# Patient Record
Sex: Male | Born: 1990 | Race: Black or African American | Hispanic: No | Marital: Single
Health system: Midwestern US, Community
[De-identification: ages and names within clinical notes are randomized; demographics above are authoritative.]

## PROBLEM LIST (undated history)

## (undated) DIAGNOSIS — J45909 Unspecified asthma, uncomplicated: Secondary | ICD-10-CM

## (undated) HISTORY — PX: FRACTURE SURGERY: SHX138

---
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
## 2012-05-17 ENCOUNTER — Emergency Department: Payer: Self-pay | Admitting: Emergency Medicine

## 2013-06-02 ENCOUNTER — Emergency Department: Payer: Self-pay | Admitting: Emergency Medicine

## 2013-11-09 ENCOUNTER — Emergency Department: Payer: Self-pay | Admitting: Emergency Medicine

## 2013-11-09 LAB — CBC
HCT: 40.1 % (ref 40.0–52.0)
HGB: 12.7 g/dL — AB (ref 13.0–18.0)
MCH: 27.2 pg (ref 26.0–34.0)
MCHC: 31.8 g/dL — ABNORMAL LOW (ref 32.0–36.0)
MCV: 86 fL (ref 80–100)
PLATELETS: 231 10*3/uL (ref 150–440)
RBC: 4.69 10*6/uL (ref 4.40–5.90)
RDW: 13.8 % (ref 11.5–14.5)
WBC: 6.4 10*3/uL (ref 3.8–10.6)

## 2013-11-09 LAB — URINALYSIS, COMPLETE
Bacteria: NONE SEEN
Bilirubin,UR: NEGATIVE
Blood: NEGATIVE
GLUCOSE, UR: NEGATIVE mg/dL (ref 0–75)
Ketone: NEGATIVE
Nitrite: NEGATIVE
Ph: 6 (ref 4.5–8.0)
Protein: NEGATIVE
RBC,UR: 2 /HPF (ref 0–5)
SQUAMOUS EPITHELIAL: NONE SEEN
Specific Gravity: 1.024 (ref 1.003–1.030)
WBC UR: 95 /HPF (ref 0–5)

## 2013-11-09 LAB — COMPREHENSIVE METABOLIC PANEL
ALBUMIN: 3.5 g/dL (ref 3.4–5.0)
AST: 22 U/L (ref 15–37)
Alkaline Phosphatase: 99 U/L
Anion Gap: 5 — ABNORMAL LOW (ref 7–16)
BUN: 13 mg/dL (ref 7–18)
Bilirubin,Total: 0.4 mg/dL (ref 0.2–1.0)
CHLORIDE: 108 mmol/L — AB (ref 98–107)
CREATININE: 1.02 mg/dL (ref 0.60–1.30)
Calcium, Total: 8.4 mg/dL — ABNORMAL LOW (ref 8.5–10.1)
Co2: 28 mmol/L (ref 21–32)
EGFR (African American): >60
GLUCOSE: 86 mg/dL (ref 65–99)
Osmolality: 281 (ref 275–301)
Potassium: 3.9 mmol/L (ref 3.5–5.1)
SGPT (ALT): 36 U/L
SODIUM: 141 mmol/L (ref 136–145)
Total Protein: 6.4 g/dL (ref 6.4–8.2)

## 2013-11-09 LAB — LIPASE, BLOOD: LIPASE: 75 U/L (ref 73–393)

## 2014-04-08 ENCOUNTER — Emergency Department: Payer: Self-pay | Admitting: Emergency Medicine

## 2014-05-06 ENCOUNTER — Emergency Department
Admit: 2014-05-06 | Disposition: A | Discharge status: Home / Self Care | Payer: Self-pay | Admitting: Emergency Medicine

## 2014-08-26 ENCOUNTER — Emergency Department: Payer: Self-pay

## 2014-08-26 ENCOUNTER — Emergency Department
Admission: EM | Admit: 2014-08-26 | Discharge: 2014-08-26 | Disposition: A | Discharge status: Home / Self Care | Payer: Self-pay | Attending: Emergency Medicine | Admitting: Emergency Medicine

## 2014-08-26 DIAGNOSIS — M25522 Pain in left elbow: Secondary | ICD-10-CM

## 2014-08-26 DIAGNOSIS — S60221A Contusion of right hand, initial encounter: Secondary | ICD-10-CM | POA: Insufficient documentation

## 2014-08-26 DIAGNOSIS — Z720 Tobacco use: Secondary | ICD-10-CM | POA: Insufficient documentation

## 2014-08-26 DIAGNOSIS — Y9367 Activity, basketball: Secondary | ICD-10-CM | POA: Insufficient documentation

## 2014-08-26 DIAGNOSIS — S50312A Abrasion of left elbow, initial encounter: Secondary | ICD-10-CM | POA: Insufficient documentation

## 2014-08-26 DIAGNOSIS — Y998 Other external cause status: Secondary | ICD-10-CM | POA: Insufficient documentation

## 2014-08-26 DIAGNOSIS — Y92310 Basketball court as the place of occurrence of the external cause: Secondary | ICD-10-CM | POA: Insufficient documentation

## 2014-08-26 DIAGNOSIS — W228XXA Striking against or struck by other objects, initial encounter: Secondary | ICD-10-CM | POA: Insufficient documentation

## 2014-08-26 MED ORDER — NAPROXEN 500 MG PO TABS: 500.0000 mg | ORAL_TABLET | Freq: Two times a day (BID) | ORAL | Status: DC

## 2014-08-26 NOTE — ED Notes (Signed)
Left elbow pain after injury today playing basketball. Pt also c/o right hand pain on knuckles for hitting wall last week. Full movement of all extremities. Pt alert and oriented X4, active, cooperative, pt in NAD. RR even and unlabored, color WNL.

## 2014-08-26 NOTE — ED Notes (Signed)
Portable x-ray at the bedside.  

## 2014-08-26 NOTE — ED Provider Notes (Signed)
[facility] Emergency Department Provider Note  ____________________________________________  Time seen: Approximately 3:29 PM  I have reviewed the triage vital signs and the nursing notes.   HISTORY  Chief Complaint Elbow Pain    HPI Dravon Nott. is a 24 y.o. male complaining of left elbow pain after playing basketball today. Patient also complaining of right hand pain secondary to hitting a wall last week. Patient has full range of motion of the affected extremities just hurt with movement. Patient is right-hand dominant. Patient rating his overall pain and discomfort as a 7/10. No palliative measures taken for each with these complaints.   History reviewed. No pertinent past medical history.  There are no active problems to display for this patient.   History reviewed. No pertinent past surgical history.  No current outpatient prescriptions on file.  Allergies Review of patient's allergies indicates no known allergies.  No family history on file.  Social History History  Substance Use Topics  . Smoking status: Current Every Day Smoker  . Smokeless tobacco: Not on file  . Alcohol Use: No    Review of Systems Constitutional: No fever/chills Eyes: No visual changes. ENT: No sore throat. Cardiovascular: Denies chest pain. Respiratory: Denies shortness of breath. Gastrointestinal: No abdominal pain.  No nausea, no vomiting.  No diarrhea.  No constipation. Genitourinary: Negative for dysuria. Musculoskeletal: Right hand and left elbow pain. Skin: Negative for rash. Neurological: Negative for headaches, focal weakness or numbness. 10-point ROS otherwise negative.  ____________________________________________   PHYSICAL EXAM:  VITAL SIGNS: ED Triage Vitals  Enc Vitals Group     BP 08/26/14 1512 114/66 mmHg     Pulse Rate 08/26/14 1512 55     Resp 08/26/14 1512 0     Temp 08/26/14 1512 98.5 F (36.9 C)     Temp Source  08/26/14 1512 Oral     SpO2 08/26/14 1512 100 %     Weight 08/26/14 1512 175 lb (79.379 kg)     Height 08/26/14 1512 6' (1.829 m)     Head Cir --      Peak Flow --      Pain Score 08/26/14 1512 7     Pain Loc --      Pain Edu? --      Excl. in GC? --     Constitutional: Alert and oriented. Well appearing and in no acute distress. Eyes: Conjunctivae are normal. PERRL. EOMI. Head: Atraumatic. Nose: No congestion/rhinnorhea. Mouth/Throat: Mucous membranes are moist.  Oropharynx non-erythematous. Neck: No stridor.  No cervical spine tenderness to palpation. Hematological/Lymphatic/Immunilogical: No cervical lymphadenopathy. Cardiovascular: Normal rate, regular rhythm. Grossly normal heart sounds.  Good peripheral circulation. Respiratory: Normal respiratory effort.  No retractions. Lungs CTAB. Gastrointestinal: Soft and nontender. No distention. No abdominal bruits. No CVA tenderness. Musculoskeletal: No obvious deformity of the right hand there is some mild edema of the fifth metacarpal head. Right hand is neurovascularly intact free nuchal range of motion. Examination left elbow reveals no deformity there is a mild abrasion is neurovascular intact decreased range of motion with extension. Grip strength is 5 over 5 in the left upper extremity and 3 over 5 in the right upper extremity.  Neurologic:  Normal speech and language. No gross focal neurologic deficits are appreciated. No gait instability. Skin:  Skin is warm, dry and intact. No rash noted. Abrasion posterior left elbow Psychiatric: Mood and affect are normal. Speech and behavior are normal.  ____________________________________________   LABS (all labs ordered  are listed, but only abnormal results are displayed)  Labs Reviewed - No data to display ____________________________________________  EKG   ____________________________________________  RADIOLOGY  No acute findings on x-ray of the right hand and left elbow. I,  Joni Reining, personally viewed and evaluated these images as part of my medical decision making.   ____________________________________________   PROCEDURES  Procedure(s) performed: None  Critical Care performed: No  ____________________________________________   INITIAL IMPRESSION / ASSESSMENT AND PLAN / ED COURSE  Pertinent labs & imaging results that were available during my care of the patient were reviewed by me and considered in my medical decision making (see chart for details).  Right hand contusion. Left elbow contusion. Patient given advice on home care. Prescription for naproxen take as directed. Patient [facility]. ____________________________________________   FINAL CLINICAL IMPRESSION(S) / ED DIAGNOSES  Final diagnoses:  Contusion of right hand, initial encounter  Left elbow pain      Joni Reining, PA-C 08/26/14 1634  Phineas Semen, MD 08/26/14 [phone]

## 2014-10-19 DIAGNOSIS — R1110 Vomiting, unspecified: Secondary | ICD-10-CM | POA: Insufficient documentation

## 2014-10-19 DIAGNOSIS — Z720 Tobacco use: Secondary | ICD-10-CM | POA: Insufficient documentation

## 2014-10-19 LAB — URINALYSIS COMPLETE WITH MICROSCOPIC (ARMC ONLY)
Bacteria, UA: NONE SEEN
Bilirubin Urine: NEGATIVE
Glucose, UA: NEGATIVE mg/dL
Hgb urine dipstick: NEGATIVE
NITRITE: NEGATIVE
PROTEIN: NEGATIVE mg/dL
RBC / HPF: NONE SEEN RBC/hpf (ref 0–5)
SPECIFIC GRAVITY, URINE: 1.031 — AB (ref 1.005–1.030)
pH: 6 (ref 5.0–8.0)

## 2014-10-19 NOTE — ED Notes (Signed)
Vomited multiple times today, no diarrhea, no abd pain.  States has been drinking fluids without difficulty.

## 2014-10-20 ENCOUNTER — Emergency Department
Admission: EM | Admit: 2014-10-20 | Discharge: 2014-10-20 | Discharge status: Against Medical Advice | Payer: Self-pay | Attending: Emergency Medicine | Admitting: Emergency Medicine

## 2014-10-20 NOTE — ED Notes (Signed)
Called to go to room no answer  

## 2014-10-23 ENCOUNTER — Encounter: Payer: Self-pay | Admitting: Emergency Medicine

## 2014-10-23 ENCOUNTER — Emergency Department
Admission: EM | Admit: 2014-10-23 | Discharge: 2014-10-23 | Disposition: A | Discharge status: Home / Self Care | Payer: Self-pay | Attending: Emergency Medicine | Admitting: Emergency Medicine

## 2014-10-23 DIAGNOSIS — R109 Unspecified abdominal pain: Secondary | ICD-10-CM | POA: Insufficient documentation

## 2014-10-23 DIAGNOSIS — M546 Pain in thoracic spine: Secondary | ICD-10-CM | POA: Insufficient documentation

## 2014-10-23 DIAGNOSIS — R112 Nausea with vomiting, unspecified: Secondary | ICD-10-CM | POA: Insufficient documentation

## 2014-10-23 DIAGNOSIS — Z720 Tobacco use: Secondary | ICD-10-CM | POA: Insufficient documentation

## 2014-10-23 HISTORY — DX: Unspecified asthma, uncomplicated: J45.909

## 2014-10-23 LAB — URINALYSIS COMPLETE WITH MICROSCOPIC (ARMC ONLY)
Bacteria, UA: NONE SEEN
Bilirubin Urine: NEGATIVE
Glucose, UA: NEGATIVE mg/dL
HGB URINE DIPSTICK: NEGATIVE
KETONES UR: NEGATIVE mg/dL
NITRITE: NEGATIVE
PH: 6 (ref 5.0–8.0)
PROTEIN: NEGATIVE mg/dL
SPECIFIC GRAVITY, URINE: 1.024 (ref 1.005–1.030)
Squamous Epithelial / LPF: NONE SEEN

## 2014-10-23 MED ORDER — NAPROXEN 500 MG PO TABS: 500.0000 mg | ORAL_TABLET | Freq: Two times a day (BID) | ORAL | Status: DC

## 2014-10-23 MED ORDER — KETOROLAC TROMETHAMINE 60 MG/2ML IM SOLN
60.0000 mg | Freq: Once | INTRAMUSCULAR | Status: AC
  Administered 2014-10-23: 60 mg via INTRAMUSCULAR
  Filled 2014-10-23: qty 2

## 2014-10-23 NOTE — ED Notes (Addendum)
Pt presents to ED via POV from personal home with c/o of mid-back pain. Pt states he has been experiencing presenting sx for approximately 2-3 days with accompanying nausea/vomiting. Pt states back pain radiates to left side abdomen with accompanying nausea/vomiting sx. Pt denies mechanical injury from work site, but does state he noticed presenting sx at employment location on Tuesday. No bruising, bleeding, or swelling noted to affected areas. Pt denies nausea/vomiting sx at this time. Pt is alert and oriented x4.

## 2014-10-23 NOTE — Discharge Instructions (Signed)
Back Pain, Adult Low back pain is very common. About 1 in 5 people have back pain.The cause of low back pain is rarely dangerous. The pain often gets better over time.About half of people with a sudden onset of back pain feel better in just 2 weeks. About 8 in 10 people feel better by 6 weeks.  CAUSES Some common causes of back pain include:  Strain of the muscles or ligaments supporting the spine.  Wear and tear (degeneration) of the spinal discs.  Arthritis.  Direct injury to the back. DIAGNOSIS Most of the time, the direct cause of low back pain is not known.However, back pain can be treated effectively even when the exact cause of the pain is unknown.Answering your caregiver's questions about your overall health and symptoms is one of the most accurate ways to make sure the cause of your pain is not dangerous. If your caregiver needs more information, he or she may order lab work or imaging tests (X-rays or MRIs).However, even if imaging tests show changes in your back, this usually does not require surgery. HOME CARE INSTRUCTIONS For many people, back pain returns.Since low back pain is rarely dangerous, it is often a condition that people can learn to manageon their own.   Remain active. It is stressful on the back to sit or stand in one place. Do not sit, drive, or stand in one place for more than 30 minutes at a time. Take short walks on level surfaces as soon as pain allows.Try to increase the length of time you walk each day.  Do not stay in bed.Resting more than 1 or 2 days can delay your recovery.  Do not avoid exercise or work.Your body is made to move.It is not dangerous to be active, even though your back may hurt.Your back will likely heal faster if you return to being active before your pain is gone.  Pay attention to your body when you bend and lift. Many people have less discomfortwhen lifting if they bend their knees, keep the load close to their bodies,and  avoid twisting. Often, the most comfortable positions are those that put less stress on your recovering back.  Find a comfortable position to sleep. Use a firm mattress and lie on your side with your knees slightly bent. If you lie on your back, put a pillow under your knees.  Only take over-the-counter or prescription medicines as directed by your caregiver. Over-the-counter medicines to reduce pain and inflammation are often the most helpful.Your caregiver may prescribe muscle relaxant drugs.These medicines help dull your pain so you can more quickly return to your normal activities and healthy exercise.  Put ice on the injured area.  Put ice in a plastic bag.  Place a towel between your skin and the bag.  Leave the ice on for 15-20 minutes, 03-04 times a day for the first 2 to 3 days. After that, ice and heat may be alternated to reduce pain and spasms.  Ask your caregiver about trying back exercises and gentle massage. This may be of some benefit.  Avoid feeling anxious or stressed.Stress increases muscle tension and can worsen back pain.It is important to recognize when you are anxious or stressed and learn ways to manage it.Exercise is a great option. SEEK MEDICAL CARE IF:  You have pain that is not relieved with rest or medicine.  You have pain that does not improve in 1 week.  You have new symptoms.  You are generally not feeling well. SEEK   IMMEDIATE MEDICAL CARE IF:   You have pain that radiates from your back into your legs.  You develop new bowel or bladder control problems.  You have unusual weakness or numbness in your arms or legs.  You develop nausea or vomiting.  You develop abdominal pain.  You feel faint. Document Released: 01/15/2005 Document Revised: 07/17/2011 Document Reviewed: 05/19/2013 Exit[employer] Patient Information 2015 [employer]. This information is not intended to replace advice given to you by your health care provider. Make sure you  discuss any questions you have with your health care provider.  

## 2014-10-23 NOTE — ED Notes (Signed)
Patient with no complaints at this time. Respirations even and unlabored. Skin warm/dry. Discharge instructions reviewed with patient at this time. Patient given opportunity to voice concerns/ask questions. Patient discharged at this time and left Emergency Department with steady gait.   

## 2014-10-23 NOTE — ED Provider Notes (Signed)
[facility] Emergency Department Provider Note  ____________________________________________  Time seen: Approximately 8:19 PM  I have reviewed the triage vital signs and the nursing notes.   HISTORY  Chief Complaint Back Pain   HPI Evan Leblanc. is a 24 y.o. male is here with complaint of left mid back pain that occasionally radiates around to his left anterior side but not to his groin area. Patient states that this is been occurring for approximately 4-5  days. He states that he has had nausea and vomiting with this but denies any vomiting today. Currently his nausea is subsided. He denies any known injury to his back. He denies any previous urinary tract infections or kidney stones. He states he took some ibuprofen without improvement.4 days ago he was in the emergency room but left prior to being seen since he had to be at work. Really his pain is 7 out of 10.   Past Medical History  Diagnosis Date  . Asthma     There are no active problems to display for this patient.   History reviewed. No pertinent past surgical history.  Current Outpatient Rx  Name  Route  Sig  Dispense  Refill  . naproxen (NAPROSYN) 500 MG tablet   Oral   Take 1 tablet (500 mg total) by mouth 2 (two) times daily with a meal.   30 tablet   0     Allergies Review of patient's allergies indicates no known allergies.  No family history on file.  Social History Social History  Substance Use Topics  . Smoking status: Current Every Day Smoker  . Smokeless tobacco: None  . Alcohol Use: No    Review of Systems Constitutional: No fever/chills ENT: No sore throat. Cardiovascular: Denies chest pain. Respiratory: Denies shortness of breath. Gastrointestinal: No abdominal pain.  Positive nausea, positive vomiting.  No diarrhea.  No constipation. Genitourinary: Negative for dysuria. Musculoskeletal: Positive left flank pain Skin: Negative for rash. Neurological:  Negative for headaches, focal weakness or numbness.  10-point ROS otherwise negative.  ____________________________________________   PHYSICAL EXAM:  VITAL SIGNS: ED Triage Vitals  Enc Vitals Group     BP 10/23/14 2003 99/79 mmHg     Pulse Rate 10/23/14 2003 55     Resp 10/23/14 2003 16     Temp 10/23/14 2003 97.8 F (36.6 C)     Temp Source 10/23/14 2003 Oral     SpO2 10/23/14 2003 98 %     Weight 10/23/14 2003 160 lb (72.576 kg)     Height 10/23/14 2003  (1.854 m)     Head Cir --      Peak Flow --      Pain Score 10/23/14 2003 7     Pain Loc --      Pain Edu? --      Excl. in GC? --     Constitutional: Alert and oriented. Well appearing and in no acute distress. Eyes: Conjunctivae are normal. PERRL. EOMI. Head: Atraumatic. Nose: No congestion/rhinnorhea. Neck: No stridor.   Cardiovascular: Normal rate, regular rhythm. Grossly normal heart sounds.  Good peripheral circulation. Respiratory: Normal respiratory effort.  No retractions. Lungs CTAB. Gastrointestinal: Soft and nontender. No distention. Mildly positive left CVA tenderness. Musculoskeletal: Examination of the back feels show any gross abnormality. There is moderate tenderness on palpation of the left lateral back. There is no active muscle spasm seen. Normal gait was noted. Straight leg raises were negative. Neurologic:  Normal speech and  language. No gross focal neurologic deficits are appreciated. No gait instability. Reflexes 2+ bilaterally Skin:  Skin is warm, dry and intact. No rash noted. Psychiatric: Mood and affect are normal. Speech and behavior are normal.  ____________________________________________   LABS (all labs ordered are listed, but only abnormal results are displayed)  Labs Reviewed  URINALYSIS COMPLETEWITH MICROSCOPIC ([facility] ONLY) - Abnormal; Notable for the following:    Color, Urine YELLOW (*)    APPearance HAZY (*)    Leukocytes, UA TRACE (*)    All other components within  normal limits    RADIOLOGY  Deferred ____________________________________________   PROCEDURES  Procedure(s) performed: None  Critical Care performed: No  ____________________________________________   INITIAL IMPRESSION / ASSESSMENT AND PLAN / ED COURSE  Pertinent labs & imaging results that were available during my care of the patient were reviewed by me and considered in my medical decision making (see chart for details).  Urinalysis was negative for blood or WBC's.  Patient was given a shot of Toradol 60 mg IM and a prescription for naproxen 500 mg twice a day with food. He is to follow-up with his PCP if any continued problems or return to the emergency room if any severe worsening of symptoms. He is also requesting a note for work for tomorrow. ____________________________________________   FINAL CLINICAL IMPRESSION(S) / ED DIAGNOSES  Final diagnoses:  Left-sided thoracic back pain      Tommi Rumps, PA-C 10/23/14 2128  Jene Every, MD 10/23/14 2350

## 2015-02-23 ENCOUNTER — Emergency Department
Admission: EM | Admit: 2015-02-23 | Discharge: 2015-02-23 | Disposition: A | Discharge status: Home / Self Care | Payer: Self-pay | Attending: Emergency Medicine | Admitting: Emergency Medicine

## 2015-02-23 ENCOUNTER — Encounter: Payer: Self-pay | Admitting: *Deleted

## 2015-02-23 DIAGNOSIS — R079 Chest pain, unspecified: Secondary | ICD-10-CM | POA: Insufficient documentation

## 2015-02-23 DIAGNOSIS — Z791 Long term (current) use of non-steroidal anti-inflammatories (NSAID): Secondary | ICD-10-CM | POA: Insufficient documentation

## 2015-02-23 DIAGNOSIS — F17200 Nicotine dependence, unspecified, uncomplicated: Secondary | ICD-10-CM | POA: Insufficient documentation

## 2015-02-23 DIAGNOSIS — K2970 Gastritis, unspecified, without bleeding: Secondary | ICD-10-CM | POA: Insufficient documentation

## 2015-02-23 LAB — BASIC METABOLIC PANEL
Anion gap: 8 (ref 5–15)
BUN: 14 mg/dL (ref 6–20)
CALCIUM: 8.9 mg/dL (ref 8.9–10.3)
CO2: 26 mmol/L (ref 22–32)
CREATININE: 1.09 mg/dL (ref 0.61–1.24)
Chloride: 106 mmol/L (ref 101–111)
GFR calc Af Amer: >60 mL/min (ref >60)
GLUCOSE: 102 mg/dL — AB (ref 65–99)
Potassium: 4.2 mmol/L (ref 3.5–5.1)
Sodium: 140 mmol/L (ref 135–145)

## 2015-02-23 LAB — CBC
HCT: 40.7 % (ref 40.0–52.0)
Hemoglobin: 13.2 g/dL (ref 13.0–18.0)
MCH: 27.5 pg (ref 26.0–34.0)
MCHC: 32.6 g/dL (ref 32.0–36.0)
MCV: 84.5 fL (ref 80.0–100.0)
PLATELETS: 207 10*3/uL (ref 150–440)
RBC: 4.81 MIL/uL (ref 4.40–5.90)
RDW: 13.5 % (ref 11.5–14.5)
WBC: 5.2 10*3/uL (ref 3.8–10.6)

## 2015-02-23 LAB — TROPONIN I: Troponin I: <0.03 ng/mL (ref <0.031)

## 2015-02-23 MED ORDER — GI COCKTAIL ~~LOC~~
ORAL | Status: AC
  Filled 2015-02-23: qty 30

## 2015-02-23 MED ORDER — FAMOTIDINE 20 MG PO TABS
ORAL_TABLET | ORAL | Status: AC
  Filled 2015-02-23: qty 2

## 2015-02-23 MED ORDER — FAMOTIDINE 20 MG PO TABS
40.0000 mg | ORAL_TABLET | Freq: Once | ORAL | Status: AC
  Administered 2015-02-23: 40 mg via ORAL

## 2015-02-23 MED ORDER — GI COCKTAIL ~~LOC~~
30.0000 mL | ORAL | Status: AC
  Administered 2015-02-23: 30 mL via ORAL

## 2015-02-23 MED ORDER — METOCLOPRAMIDE HCL 10 MG PO TABS
ORAL_TABLET | ORAL | Status: AC
  Filled 2015-02-23: qty 1

## 2015-02-23 MED ORDER — SUCRALFATE 1 G PO TABS: 1.0000 g | ORAL_TABLET | Freq: Four times a day (QID) | ORAL | Status: DC

## 2015-02-23 MED ORDER — RANITIDINE HCL 150 MG PO CAPS: 150.0000 mg | ORAL_CAPSULE | Freq: Two times a day (BID) | ORAL | Status: DC

## 2015-02-23 NOTE — ED Notes (Signed)
Pt reports pain that starts in center of chest and radiates towards stomach.  Pt sts pain started today.  Pt reports nausea but has not thrown up.  Denies SOB, LOC or fever.

## 2015-02-23 NOTE — ED Notes (Signed)
Pt has chest pain for 1 day.  cig smoker.  No cough. No back pain.  No sob.  No n/v/d.  Pt reports pain in center of chest has worsened during the day.  Pt alert.  Skin warm and dry.

## 2015-02-23 NOTE — Discharge Instructions (Signed)
Nonspecific Chest Pain  Chest pain can be caused by many different conditions. There is always a chance that your pain could be related to something serious, such as a heart attack or a blood clot in your lungs. Chest pain can also be caused by conditions that are not life-threatening. If you have chest pain, it is very important to follow up with your health care provider. CAUSES  Chest pain can be caused by:  Heartburn.  Pneumonia or bronchitis.  Anxiety or stress.  Inflammation around your heart (pericarditis) or lung (pleuritis or pleurisy).  A blood clot in your lung.  A collapsed lung (pneumothorax). It can develop suddenly on its own (spontaneous pneumothorax) or from trauma to the chest.  Shingles infection (varicella-zoster virus).  Heart attack.  Damage to the bones, muscles, and cartilage that make up your chest wall. This can include:  Bruised bones due to injury.  Strained muscles or cartilage due to frequent or repeated coughing or overwork.  Fracture to one or more ribs.  Sore cartilage due to inflammation (costochondritis). RISK FACTORS  Risk factors for chest pain may include:  Activities that increase your risk for trauma or injury to your chest.  Respiratory infections or conditions that cause frequent coughing.  Medical conditions or overeating that can cause heartburn.  Heart disease or family history of heart disease.  Conditions or health behaviors that increase your risk of developing a blood clot.  Having had chicken pox (varicella zoster). SIGNS AND SYMPTOMS Chest pain can feel like:  Burning or tingling on the surface of your chest or deep in your chest.  Crushing, pressure, aching, or squeezing pain.  Dull or sharp pain that is worse when you move, cough, or take a deep breath.  Pain that is also felt in your back, neck, shoulder, or arm, or pain that spreads to any of these areas. Your chest pain may come and go, or it may stay  constant. DIAGNOSIS Lab tests or other studies may be needed to find the cause of your pain. Your health care provider may have you take a test called an ambulatory ECG (electrocardiogram). An ECG records your heartbeat patterns at the time the test is performed. You may also have other tests, such as:  Transthoracic echocardiogram (TTE). During echocardiography, sound waves are used to create a picture of all of the heart structures and to look at how blood flows through your heart.  Transesophageal echocardiogram (TEE).This is a more advanced imaging test that obtains images from inside your body. It allows your health care provider to see your heart in finer detail.  Cardiac monitoring. This allows your health care provider to monitor your heart rate and rhythm in real time.  Holter monitor. This is a portable device that records your heartbeat and can help to diagnose abnormal heartbeats. It allows your health care provider to track your heart activity for several days, if needed.  Stress tests. These can be done through exercise or by taking medicine that makes your heart beat more quickly.  Blood tests.  Imaging tests. TREATMENT  Your treatment depends on what is causing your chest pain. Treatment may include:  Medicines. These may include:  Acid blockers for heartburn.  Anti-inflammatory medicine.  Pain medicine for inflammatory conditions.  Antibiotic medicine, if an infection is present.  Medicines to dissolve blood clots.  Medicines to treat coronary artery disease.  Supportive care for conditions that do not require medicines. This may include:  Resting.  Applying heat  or cold packs to injured areas.  Limiting activities until pain decreases. HOME CARE INSTRUCTIONS  If you were prescribed an antibiotic medicine, finish it all even if you start to feel better.  Avoid any activities that bring on chest pain.  Do not use any tobacco products, including  cigarettes, chewing tobacco, or electronic cigarettes. If you need help quitting, ask your health care provider.  Do not drink alcohol.  Take medicines only as directed by your health care provider.  Keep all follow-up visits as directed by your health care provider. This is important. This includes any further testing if your chest pain does not go away.  If heartburn is the cause for your chest pain, you may be told to keep your head raised (elevated) while sleeping. This reduces the chance that acid will go from your stomach into your esophagus.  Make lifestyle changes as directed by your health care provider. These may include:  Getting regular exercise. Ask your health care provider to suggest some activities that are safe for you.  Eating a heart-healthy diet. A registered dietitian can help you to learn healthy eating options.  Maintaining a healthy weight.  Managing diabetes, if necessary.  Reducing stress. SEEK MEDICAL CARE IF:  Your chest pain does not go away after treatment.  You have a rash with blisters on your chest.  You have a fever. SEEK IMMEDIATE MEDICAL CARE IF:   Your chest pain is worse.  You have an increasing cough, or you cough up blood.  You have severe abdominal pain.  You have severe weakness.  You faint.  You have chills.  You have sudden, unexplained chest discomfort.  You have sudden, unexplained discomfort in your arms, back, neck, or jaw.  You have shortness of breath at any time.  You suddenly start to sweat, or your skin gets clammy.  You feel nauseous or you vomit.  You suddenly feel light-headed or dizzy.  Your heart begins to beat quickly, or it feels like it is skipping beats. These symptoms may represent a serious problem that is an emergency. Do not wait to see if the symptoms will go away. Get medical help right away. Call your local emergency services (911 in the [place] Do not drive yourself to the hospital.   This  information is not intended to replace advice given to you by your health care provider. Make sure you discuss any questions you have with your health care provider.   Document Released: 10/25/2004 Document Revised: 02/05/2014 Document Reviewed: 08/21/2013 Elsevier Interactive Patient Education 2016 [employer]  Gastritis, Adult Gastritis is soreness and swelling (inflammation) of the lining of the stomach. Gastritis can develop as a sudden onset (acute) or long-term (chronic) condition. If gastritis is not treated, it can lead to stomach bleeding and ulcers. CAUSES  Gastritis occurs when the stomach lining is weak or damaged. Digestive juices from the stomach then inflame the weakened stomach lining. The stomach lining may be weak or damaged due to viral or bacterial infections. One common bacterial infection is the Helicobacter pylori infection. Gastritis can also result from excessive alcohol consumption, taking certain medicines, or having too much acid in the stomach.  SYMPTOMS  In some cases, there are no symptoms. When symptoms are present, they may include:  Pain or a burning sensation in the upper abdomen.  Nausea.  Vomiting.  An uncomfortable feeling of fullness after eating. DIAGNOSIS  Your caregiver may suspect you have gastritis based on your symptoms and a physical  exam. To determine the cause of your gastritis, your caregiver may perform the following:  Blood or stool tests to check for the H pylori bacterium.  Gastroscopy. A thin, flexible tube (endoscope) is passed down the esophagus and into the stomach. The endoscope has a light and camera on the end. Your caregiver uses the endoscope to view the inside of the stomach.  Taking a tissue sample (biopsy) from the stomach to examine under a microscope. TREATMENT  Depending on the cause of your gastritis, medicines may be prescribed. If you have a bacterial infection, such as an H pylori infection, antibiotics may be  given. If your gastritis is caused by too much acid in the stomach, H2 blockers or antacids may be given. Your caregiver may recommend that you stop taking aspirin, ibuprofen, or other nonsteroidal anti-inflammatory drugs (NSAIDs). HOME CARE INSTRUCTIONS  Only take over-the-counter or prescription medicines as directed by your caregiver.  If you were given antibiotic medicines, take them as directed. Finish them even if you start to feel better.  Drink enough fluids to keep your urine clear or pale yellow.  Avoid foods and drinks that make your symptoms worse, such as:  Caffeine or alcoholic drinks.  Chocolate.  Peppermint or mint flavorings.  Garlic and onions.  Spicy foods.  Citrus fruits, such as oranges, lemons, or limes.  Tomato-based foods such as sauce, chili, salsa, and pizza.  Fried and fatty foods.  Eat small, frequent meals instead of large meals. SEEK IMMEDIATE MEDICAL CARE IF:   You have black or dark red stools.  You vomit blood or material that looks like coffee grounds.  You are unable to keep fluids down.  Your abdominal pain gets worse.  You have a fever.  You do not feel better after 1 week.  You have any other questions or concerns. MAKE SURE YOU:  Understand these instructions.  Will watch your condition.  Will get help right away if you are not doing well or get worse.   This information is not intended to replace advice given to you by your health care provider. Make sure you discuss any questions you have with your health care provider.   Document Released: 01/09/2001 Document Revised: 07/17/2011 Document Reviewed: 02/28/2011 Elsevier Interactive Patient Education [employer]

## 2015-02-23 NOTE — ED Provider Notes (Signed)
[facility] Emergency Department Provider Note  ____________________________________________  Time seen: 4:15 PM  I have reviewed the triage vital signs and the nursing notes.   HISTORY  Chief Complaint Chest Pain    HPI Evan Leblanc. is a 25 y.o. male who complains of constant chest pain radiating to the upper stomach since about 8:00 AM today. He woke up this morning and noticed the pain, went out to eat and had a sausage and egg biscuit sandwich and then came home and lay back down to go to sleep. The pain persisted. It was still present when he woke up again and so he came to the ER today for evaluation. No exertional or pleuritic symptoms. No shortness of breath vomiting or diaphoresis. Nonradiating except as above. No medical history. Does smoke, no family history. Patient tried NSAIDs for the pain without relief    Past Medical History  Diagnosis Date  . Asthma      There are no active problems to display for this patient.    No past surgical history on file.   Current Outpatient Rx  Name  Route  Sig  Dispense  Refill  . naproxen (NAPROSYN) 500 MG tablet   Oral   Take 1 tablet (500 mg total) by mouth 2 (two) times daily with a meal.   30 tablet   0   . ranitidine (ZANTAC) 150 MG capsule   Oral   Take 1 capsule (150 mg total) by mouth 2 (two) times daily.   28 capsule   0   . sucralfate (CARAFATE) 1 g tablet   Oral   Take 1 tablet (1 g total) by mouth 4 (four) times daily.   120 tablet   1      Allergies Review of patient's allergies indicates no known allergies.   No family history on file. Negative, no early CAD Social History Social History  Substance Use Topics  . Smoking status: Current Every Day Smoker  . Smokeless tobacco: None  . Alcohol Use: No    Review of Systems  Constitutional:   No fever or chills. No weight changes Eyes:   No blurry vision or double vision.  ENT:   No sore  throat. Cardiovascular:   Positive as above chest pain. Respiratory:   No dyspnea or cough. Gastrointestinal:   Negative for abdominal pain, vomiting and diarrhea.  No BRBPR or melena. Genitourinary:   Negative for dysuria, urinary retention, bloody urine, or difficulty urinating. Musculoskeletal:   Negative for back pain. No joint swelling or pain. Skin:   Negative for rash. Neurological:   Negative for headaches, focal weakness or numbness. Psychiatric:  No anxiety or depression.   Endocrine:  No hot/cold intolerance, changes in energy, or sleep difficulty.  10-point ROS otherwise negative.  ____________________________________________   PHYSICAL EXAM:  VITAL SIGNS: ED Triage Vitals  Enc Vitals Group     BP 02/23/15 1541 123/52 mmHg     Pulse Rate 02/23/15 1541 58     Resp --      Temp 02/23/15 1541 98.6 F (37 C)     Temp Source 02/23/15 1541 Oral     SpO2 02/23/15 1541 100 %     Weight 02/23/15 1541 160 lb (72.576 kg)     Height 02/23/15 1541  (1.854 m)     Head Cir --      Peak Flow --      Pain Score 02/23/15 1541 8  Pain Loc --      Pain Edu? --      Excl. in GC? --     Vital signs reviewed, nursing assessments reviewed.   Constitutional:   Alert and oriented. Well appearing and in no distress. Eyes:   No scleral icterus. No conjunctival pallor. PERRL. EOMI ENT   Head:   Normocephalic and atraumatic.   Nose:   No congestion/rhinnorhea. No septal hematoma   Mouth/Throat:   MMM, no pharyngeal erythema. No peritonsillar mass. No uvula shift.   Neck:   No stridor. No SubQ emphysema. No meningismus. Hematological/Lymphatic/Immunilogical:   No cervical lymphadenopathy. Cardiovascular:   RRR. Normal and symmetric distal pulses are present in all extremities. No murmurs, rubs, or gallops. Respiratory:   Normal respiratory effort without tachypnea nor retractions. Breath sounds are clear and equal bilaterally. No  wheezes/rales/rhonchi. Gastrointestinal:   Soft with epigastric and left upper quadrant tenderness.. No distention. There is no CVA tenderness.  No rebound, rigidity, or guarding. Genitourinary:   deferred Musculoskeletal:   Nontender with normal range of motion in all extremities. No joint effusions.  No lower extremity tenderness.  No edema. Anterior chest wall tenderness which reproduces the pain over the sternum Neurologic:   Normal speech and language.  CN 2-10 normal. Motor grossly intact. No pronator drift.  Normal gait. No gross focal neurologic deficits are appreciated.  Skin:    Skin is warm, dry and intact. No rash noted.  No petechiae, purpura, or bullae. Psychiatric:   Mood and affect are normal. Speech and behavior are normal. Patient exhibits appropriate insight and judgment.  ____________________________________________    LABS (pertinent positives/negatives) (all labs ordered are listed, but only abnormal results are displayed) Labs Reviewed  BASIC METABOLIC PANEL - Abnormal; Notable for the following:    Glucose, Bld 102 (*)    All other components within normal limits  TROPONIN I  CBC   ____________________________________________   EKG  Interpreted by me Sinus bradycardia rate of 52, left axis, normal intervals. Normal QRS ST segments and T waves.  ____________________________________________    RADIOLOGY    ____________________________________________   PROCEDURES   ____________________________________________   INITIAL IMPRESSION / ASSESSMENT AND PLAN / ED COURSE  Pertinent labs & imaging results that were available during my care of the patient were reviewed by me and considered in my medical decision making (see chart for details).  Patient presents with anterior chest pain that is atypical. Considering the patient's symptoms, medical history, and physical examination today, I have low suspicion for ACS, PE, TAD, pneumothorax, carditis,  mediastinitis, pneumonia, CHF, or sepsis.  Symptoms and exam are highly consistent with gastritis and GERD. We'll give GI cocktail here, prescribed trial of Carafate and Zantac. Labs are negative EKG reassuring history reassuring. Follow-up with primary care.     ____________________________________________   FINAL CLINICAL IMPRESSION(S) / ED DIAGNOSES  Final diagnoses:  Nonspecific chest pain  Gastritis      Sharman Cheek, MD 02/23/15 1702

## 2015-04-14 ENCOUNTER — Emergency Department
Admission: EM | Admit: 2015-04-14 | Discharge: 2015-04-14 | Disposition: A | Discharge status: Home / Self Care | Payer: Self-pay | Attending: Emergency Medicine | Admitting: Emergency Medicine

## 2015-04-14 ENCOUNTER — Encounter: Payer: Self-pay | Admitting: *Deleted

## 2015-04-14 DIAGNOSIS — F17200 Nicotine dependence, unspecified, uncomplicated: Secondary | ICD-10-CM | POA: Insufficient documentation

## 2015-04-14 DIAGNOSIS — R079 Chest pain, unspecified: Secondary | ICD-10-CM | POA: Insufficient documentation

## 2015-04-14 DIAGNOSIS — R1110 Vomiting, unspecified: Secondary | ICD-10-CM | POA: Insufficient documentation

## 2015-04-14 LAB — BASIC METABOLIC PANEL
Anion gap: 3 — ABNORMAL LOW (ref 5–15)
BUN: 17 mg/dL (ref 6–20)
CALCIUM: 9.1 mg/dL (ref 8.9–10.3)
CHLORIDE: 105 mmol/L (ref 101–111)
CO2: 29 mmol/L (ref 22–32)
CREATININE: 1.02 mg/dL (ref 0.61–1.24)
Glucose, Bld: 94 mg/dL (ref 65–99)
Potassium: 4.7 mmol/L (ref 3.5–5.1)
SODIUM: 137 mmol/L (ref 135–145)

## 2015-04-14 LAB — CBC
HCT: 38.9 % — ABNORMAL LOW (ref 40.0–52.0)
HEMOGLOBIN: 13.1 g/dL (ref 13.0–18.0)
MCH: 27.8 pg (ref 26.0–34.0)
MCHC: 33.6 g/dL (ref 32.0–36.0)
MCV: 82.9 fL (ref 80.0–100.0)
Platelets: 244 10*3/uL (ref 150–440)
RBC: 4.7 MIL/uL (ref 4.40–5.90)
RDW: 14.1 % (ref 11.5–14.5)
WBC: 6.6 10*3/uL (ref 3.8–10.6)

## 2015-04-14 LAB — TROPONIN I

## 2015-04-14 NOTE — ED Notes (Addendum)
Pt brought in via ems from work.  Pt reports at 2000 tonight pain began in center of chest.  Vomited x 4.   nonradiating pain.  No sob. cig smoker. Pt alert.

## 2015-06-07 ENCOUNTER — Emergency Department
Admission: EM | Admit: 2015-06-07 | Discharge: 2015-06-07 | Disposition: A | Discharge status: Home / Self Care | Payer: Self-pay | Attending: Emergency Medicine | Admitting: Emergency Medicine

## 2015-06-07 ENCOUNTER — Encounter: Payer: Self-pay | Admitting: Emergency Medicine

## 2015-06-07 DIAGNOSIS — Z79899 Other long term (current) drug therapy: Secondary | ICD-10-CM | POA: Insufficient documentation

## 2015-06-07 DIAGNOSIS — J45909 Unspecified asthma, uncomplicated: Secondary | ICD-10-CM | POA: Insufficient documentation

## 2015-06-07 DIAGNOSIS — Z791 Long term (current) use of non-steroidal anti-inflammatories (NSAID): Secondary | ICD-10-CM | POA: Insufficient documentation

## 2015-06-07 DIAGNOSIS — F17200 Nicotine dependence, unspecified, uncomplicated: Secondary | ICD-10-CM | POA: Insufficient documentation

## 2015-06-07 DIAGNOSIS — B349 Viral infection, unspecified: Secondary | ICD-10-CM | POA: Insufficient documentation

## 2015-06-07 MED ORDER — NAPROXEN 500 MG PO TABS: 500.0000 mg | ORAL_TABLET | Freq: Two times a day (BID) | ORAL | Status: DC

## 2015-06-07 MED ORDER — ONDANSETRON HCL 4 MG PO TABS: 4.0000 mg | ORAL_TABLET | Freq: Every day | ORAL | Status: DC | PRN

## 2015-06-07 MED ORDER — KETOROLAC TROMETHAMINE 60 MG/2ML IM SOLN
30.0000 mg | Freq: Once | INTRAMUSCULAR | Status: AC
  Administered 2015-06-07: 30 mg via INTRAMUSCULAR

## 2015-06-07 NOTE — Discharge Instructions (Signed)
Take medication as needed  

## 2015-06-07 NOTE — ED Provider Notes (Signed)
[facility] Emergency Department Provider Note   ____________________________________________  Time seen: Approximately 10:00 AM  I have reviewed the triage vital signs and the nursing notes.   HISTORY  Chief Complaint Emesis    HPI Evan EmeryMalcolm Q Hodzic Jr. is a 25 y.o. male patient complain of malaise and GI complaint for 2 days. Patient stated vomiting yesterday resolved this morning. Patient stated no diarrhea just nausea. Patient state having chills but unsure of fever. Patient denies any upper respiratory complaints. No palliative measures taken for this complaint. Patient rates his pain discomfort as a 10 over 10. Patient describes pain as "achy".   Past Medical History  Diagnosis Date  . Asthma     There are no active problems to display for this patient.   History reviewed. No pertinent past surgical history.  Current Outpatient Rx  Name  Route  Sig  Dispense  Refill  . naproxen (NAPROSYN) 500 MG tablet   Oral   Take 1 tablet (500 mg total) by mouth 2 (two) times daily with a meal.   30 tablet   0   . naproxen (NAPROSYN) 500 MG tablet   Oral   Take 1 tablet (500 mg total) by mouth 2 (two) times daily with a meal.   20 tablet   0   . ondansetron (ZOFRAN) 4 MG tablet   Oral   Take 1 tablet (4 mg total) by mouth daily as needed for nausea or vomiting.   10 tablet   0   . ranitidine (ZANTAC) 150 MG capsule   Oral   Take 1 capsule (150 mg total) by mouth 2 (two) times daily.   28 capsule   0   . sucralfate (CARAFATE) 1 g tablet   Oral   Take 1 tablet (1 g total) by mouth 4 (four) times daily.   120 tablet   1     Allergies Review of patient's allergies indicates no known allergies.  History reviewed. No pertinent family history.  Social History Social History  Substance Use Topics  . Smoking status: Current Every Day Smoker  . Smokeless tobacco: None  . Alcohol Use: No    Review of Systems Constitutional: No  fever; having chills. Body aches  Eyes: No visual changes. ENT: No sore throat. Cardiovascular: Denies chest pain. Respiratory: Denies shortness of breath. Gastrointestinal: No abdominal pain.  Resolved vomiting but still experiencing nausea.  No diarrhea.  No constipation. Genitourinary: Negative for dysuria. Musculoskeletal: Negative for back pain. Skin: Negative for rash. Neurological: Negative for headaches, focal weakness or numbness.   ____________________________________________   PHYSICAL EXAM:  VITAL SIGNS: ED Triage Vitals  Enc Vitals Group     BP 06/07/15 0918 115/74 mmHg     Pulse Rate 06/07/15 0918 58     Resp 06/07/15 0918 18     Temp 06/07/15 0918 98 F (36.7 C)     Temp Source 06/07/15 0918 Oral     SpO2 06/07/15 0918 100 %     Weight 06/07/15 0918 170 lb (77.111 kg)     Height 06/07/15 0918 6\' 1"  (1.854 m)     Head Cir --      Peak Flow --      Pain Score 06/07/15 0919 10     Pain Loc --      Pain Edu? --      Excl. in GC? --     Constitutional: Alert and oriented. Well appearing and in no acute distress.  Eyes: Conjunctivae are normal. PERRL. EOMI. Head: Atraumatic. Nose: No congestion/rhinnorhea. Mouth/Throat: Mucous membranes are moist.  Oropharynx non-erythematous. Neck: No stridor.  No cervical spine tenderness to palpation. Cardiovascular: Normal rate, regular rhythm. Grossly normal heart sounds.  Good peripheral circulation.Bradycardia Respiratory: Normal respiratory effort.  No retractions. Lungs CTAB. Gastrointestinal: Soft and nontender. No distention. No abdominal bruits. No CVA tenderness. Musculoskeletal: No lower extremity tenderness nor edema.  No joint effusions. Neurologic:  Normal speech and language. No gross focal neurologic deficits are appreciated. No gait instability. Skin:  Skin is warm, dry and intact. No rash noted. Psychiatric: Mood and affect are normal. Speech and behavior are  normal.  ____________________________________________   LABS (all labs ordered are listed, but only abnormal results are displayed)  Labs Reviewed - No data to display ____________________________________________  EKG   ____________________________________________  RADIOLOGY   ____________________________________________   PROCEDURES  Procedure(s) performed: None  Critical Care performed: No  ____________________________________________   INITIAL IMPRESSION / ASSESSMENT AND PLAN / ED COURSE  Pertinent labs & imaging results that were available during my care of the patient were reviewed by me and considered in my medical decision making (see chart for details).  Viral illness. Patient given discharge care instructions. Patient given a work note for today. [facility]. ____________________________________________   FINAL CLINICAL IMPRESSION(S) / ED DIAGNOSES  Final diagnoses:  Viral illness      NEW MEDICATIONS STARTED DURING THIS VISIT:  New Prescriptions   NAPROXEN (NAPROSYN) 500 MG TABLET    Take 1 tablet (500 mg total) by mouth 2 (two) times daily with a meal.   ONDANSETRON (ZOFRAN) 4 MG TABLET    Take 1 tablet (4 mg total) by mouth daily as needed for nausea or vomiting.     Note:  This document was prepared using Dragon voice recognition software and may include unintentional dictation errors.     Joni Reining, PA-C 06/07/15 1015  Emily Filbert, MD 06/07/15 [phone]

## 2015-06-07 NOTE — ED Notes (Addendum)
Pt to ed with c/o vomiting x 2 days. States 2 episodes of vomiting in the last 24 hours.  Denies diarrhea.  Pt also reports body aches.  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

## 2015-06-07 NOTE — ED Notes (Signed)
States he developed some GI sx's 2 days ago  Last time vomiting was yesterday  No diarrhea unsure of fever but c/os of chills  No cough or sore throat.. Afebrile on arrival

## 2015-11-13 ENCOUNTER — Emergency Department
Admission: EM | Admit: 2015-11-13 | Discharge: 2015-11-13 | Disposition: A | Discharge status: Home / Self Care | Payer: Self-pay | Attending: Emergency Medicine | Admitting: Emergency Medicine

## 2015-11-13 ENCOUNTER — Emergency Department: Payer: Self-pay

## 2015-11-13 ENCOUNTER — Encounter: Payer: Self-pay | Admitting: Emergency Medicine

## 2015-11-13 DIAGNOSIS — S60211A Contusion of right wrist, initial encounter: Secondary | ICD-10-CM

## 2015-11-13 DIAGNOSIS — Z23 Encounter for immunization: Secondary | ICD-10-CM | POA: Insufficient documentation

## 2015-11-13 DIAGNOSIS — W268XXA Contact with other sharp object(s), not elsewhere classified, initial encounter: Secondary | ICD-10-CM | POA: Insufficient documentation

## 2015-11-13 DIAGNOSIS — J45909 Unspecified asthma, uncomplicated: Secondary | ICD-10-CM | POA: Insufficient documentation

## 2015-11-13 DIAGNOSIS — Z79899 Other long term (current) drug therapy: Secondary | ICD-10-CM | POA: Insufficient documentation

## 2015-11-13 DIAGNOSIS — Y929 Unspecified place or not applicable: Secondary | ICD-10-CM | POA: Insufficient documentation

## 2015-11-13 DIAGNOSIS — Y9389 Activity, other specified: Secondary | ICD-10-CM | POA: Insufficient documentation

## 2015-11-13 DIAGNOSIS — Y999 Unspecified external cause status: Secondary | ICD-10-CM | POA: Insufficient documentation

## 2015-11-13 DIAGNOSIS — F17200 Nicotine dependence, unspecified, uncomplicated: Secondary | ICD-10-CM | POA: Insufficient documentation

## 2015-11-13 DIAGNOSIS — S61511A Laceration without foreign body of right wrist, initial encounter: Secondary | ICD-10-CM | POA: Insufficient documentation

## 2015-11-13 MED ORDER — HYDROCODONE-ACETAMINOPHEN 5-325 MG PO TABS
1.0000 | ORAL_TABLET | Freq: Once | ORAL | Status: AC
  Administered 2015-11-13: 1 via ORAL
  Filled 2015-11-13: qty 1

## 2015-11-13 MED ORDER — HYDROCODONE-ACETAMINOPHEN 5-325 MG PO TABS: 1.0000 | ORAL_TABLET | ORAL | 0 refills | Status: DC | PRN

## 2015-11-13 MED ORDER — IBUPROFEN 600 MG PO TABS: 600.0000 mg | ORAL_TABLET | Freq: Three times a day (TID) | ORAL | 0 refills | Status: DC | PRN

## 2015-11-13 MED ORDER — TETANUS-DIPHTH-ACELL PERTUSSIS 5-2.5-18.5 LF-MCG/0.5 IM SUSP
0.5000 mL | Freq: Once | INTRAMUSCULAR | Status: AC
  Administered 2015-11-13: 0.5 mL via INTRAMUSCULAR
  Filled 2015-11-13: qty 0.5

## 2015-11-13 NOTE — Discharge Instructions (Signed)
WOUND CARE  Keep area clean and dry for 24 hours. Do not remove bandage, if applied.  After 24 hours, remove bandage and wash wound gently with mild soap and warm water. Reapply a new bandage after cleaning wound, if directed.  Continue daily cleansing with soap and water until stitches/staples are removed.  Do not apply any ointments or creams to the wound while stitches/staples are in place, as this may cause delayed healing.  Notify the office if you experience any of the following signs of infection: Swelling, redness, pus drainage, streaking, fever >101.0 F  Notify the office if you experience excessive bleeding that does not stop after 15-20 minutes of constant, firm pressure.  Ice and elevate as needed for swelling. Wear splint for support for 3-4 days. [facility] if any continued problems.

## 2015-11-13 NOTE — ED Notes (Signed)
Pt's right wrist is in splint and bandaged. Pt instructed to use ice and elevate

## 2015-11-13 NOTE — ED Triage Notes (Signed)
Pt hit right wrist last night while moving furniture. Cut to wrist with pain and swelling.

## 2015-11-13 NOTE — ED Provider Notes (Signed)
[facility] Emergency Department Provider Note  ____________________________________________   First MD Initiated Contact with Patient 11/13/15 [phone]     (approximate)  I have reviewed the triage vital signs and the nursing notes.   HISTORY  Chief Complaint Wrist Pain    HPI Evan EmeryMalcolm Q Bau Jr. is a 25 y.o. male is here complaining of wrist pain. Patient states he was moving furniture yesterday morning when he got a superficial cut to his wrist and his continued to have pain and swelling. Patient is unaware of the last tetanus immunization that he got but states it is been many years. He has not been taking any over-the-counter medication for his pain. He states that this morning when he woke up there was increased swelling and range of motion is restricted secondary to pain. Patient rates his pain as a 7 out of 10.   Past Medical History:  Diagnosis Date  . Asthma     There are no active problems to display for this patient.   History reviewed. No pertinent surgical history.  Prior to Admission medications   Medication Sig Start Date End Date Taking? Authorizing Provider  HYDROcodone-acetaminophen (NORCO/VICODIN) 5-325 MG tablet Take 1 tablet by mouth every 4 (four) hours as needed for moderate pain. 11/13/15   Tommi Rumpshonda L Sandia Pfund, PA-C  ibuprofen (ADVIL,MOTRIN) 600 MG tablet Take 1 tablet (600 mg total) by mouth every 8 (eight) hours as needed. 11/13/15   Tommi Rumpshonda L Cordie Beazley, PA-C  sucralfate (CARAFATE) 1 g tablet Take 1 tablet (1 g total) by mouth 4 (four) times daily. 02/23/15   Sharman CheekPhillip Stafford, MD    Allergies Review of patient's allergies indicates no known allergies.  No family history on file.  Social History Social History  Substance Use Topics  . Smoking status: Current Every Day Smoker  . Smokeless tobacco: Never Used  . Alcohol use No    Review of Systems Constitutional: No fever/chills Eyes: No visual changes. Cardiovascular:  Denies chest pain. Respiratory: Denies shortness of breath. Gastrointestinal: No nausea, no vomiting.  Musculoskeletal: Positive for right wrist pain. Skin: Positive for laceration. Neurological: Negative for headaches, focal weakness or numbness.  10-point ROS otherwise negative.  ____________________________________________   PHYSICAL EXAM:  VITAL SIGNS: ED Triage Vitals [11/13/15 0934]  Enc Vitals Group     BP 115/67     Pulse Rate (!) 58     Resp 18     Temp 98 F (36.7 C)     Temp Source Oral     SpO2 97 %     Weight 165 lb (74.8 kg)     Height 6' (1.829 m)     Head Circumference      Peak Flow      Pain Score      Pain Loc      Pain Edu?      Excl. in GC?     Constitutional: Alert and oriented. Well appearing and in no acute distress. Eyes: Conjunctivae are normal. PERRL. EOMI. Head: Atraumatic. Nose: No congestion/rhinnorhea. Neck: No stridor.   Cardiovascular: Normal rate, regular rhythm. Grossly normal heart sounds.  Good peripheral circulation. Respiratory: Normal respiratory effort.  No retractions. Lungs CTAB. Musculoskeletal: Examination of the right wrist there is soft tissue swelling and range of motion is decreased secondary to pain. Area is extremely tender to palpation. No gross deformity was noted. There is a 4 cm very superficial laceration on the dorsal aspect of the right wrist. There is no active  bleeding present. No foreign body or signs of infection was noted. Neurologic:  Normal speech and language. No gross focal neurologic deficits are appreciated. No gait instability. Skin:  Skin is warm, dry. As noted above. Psychiatric: Mood and affect are normal. Speech and behavior are normal.  ____________________________________________   LABS (all labs ordered are listed, but only abnormal results are displayed)  Labs Reviewed - No data to display  RADIOLOGY  Right wrist per radiologist negative for fracture. I, Tommi Rumps, personally  viewed and evaluated these images (plain radiographs) as part of my medical decision making, as well as reviewing the written report by the radiologist. ____________________________________________   PROCEDURES  Procedure(s) performed: None  Procedures  Critical Care performed: No  ____________________________________________   INITIAL IMPRESSION / ASSESSMENT AND PLAN / ED COURSE  Pertinent labs & imaging results that were available during my care of the patient were reviewed by me and considered in my medical decision making (see chart for details).    Clinical Course   Patient is placed in a cockup wrist splint. He is given instructions to watch area for signs of infection and clean daily with mild soap and water. Patient was given a prescription for ibuprofen along with Norco. Patient was given a note stating that he was prescribed Norco to give to his [employer] administrator. This was learned late in the visit.  ____________________________________________   FINAL CLINICAL IMPRESSION(S) / ED DIAGNOSES  Final diagnoses:  Laceration of right wrist without complication, initial encounter  Contusion of right wrist, initial encounter      NEW MEDICATIONS STARTED DURING THIS VISIT:  Discharge Medication List as of 11/13/2015 12:05 PM    START taking these medications   Details  HYDROcodone-acetaminophen (NORCO/VICODIN) 5-325 MG tablet Take 1 tablet by mouth every 4 (four) hours as needed for moderate pain., Starting Sun 11/13/2015, Print    ibuprofen (ADVIL,MOTRIN) 600 MG tablet Take 1 tablet (600 mg total) by mouth every 8 (eight) hours as needed., Starting Sun 11/13/2015, Print         Note:  This document was prepared using Dragon voice recognition software and may include unintentional dictation errors.    Tommi Rumps, PA-C 11/13/15 1532    Arnaldo Natal, MD 11/13/15 [phone]

## 2016-03-27 ENCOUNTER — Emergency Department
Admission: EM | Admit: 2016-03-27 | Discharge: 2016-03-27 | Disposition: A | Discharge status: Home / Self Care | Payer: Medicaid Other | Attending: Emergency Medicine | Admitting: Emergency Medicine

## 2016-03-27 DIAGNOSIS — Y999 Unspecified external cause status: Secondary | ICD-10-CM | POA: Insufficient documentation

## 2016-03-27 DIAGNOSIS — W010XXA Fall on same level from slipping, tripping and stumbling without subsequent striking against object, initial encounter: Secondary | ICD-10-CM | POA: Insufficient documentation

## 2016-03-27 DIAGNOSIS — Y929 Unspecified place or not applicable: Secondary | ICD-10-CM | POA: Insufficient documentation

## 2016-03-27 DIAGNOSIS — S86911A Strain of unspecified muscle(s) and tendon(s) at lower leg level, right leg, initial encounter: Secondary | ICD-10-CM

## 2016-03-27 DIAGNOSIS — Z791 Long term (current) use of non-steroidal anti-inflammatories (NSAID): Secondary | ICD-10-CM | POA: Insufficient documentation

## 2016-03-27 DIAGNOSIS — S76111A Strain of right quadriceps muscle, fascia and tendon, initial encounter: Secondary | ICD-10-CM | POA: Insufficient documentation

## 2016-03-27 DIAGNOSIS — Y939 Activity, unspecified: Secondary | ICD-10-CM | POA: Insufficient documentation

## 2016-03-27 DIAGNOSIS — J45909 Unspecified asthma, uncomplicated: Secondary | ICD-10-CM | POA: Insufficient documentation

## 2016-03-27 DIAGNOSIS — Z79899 Other long term (current) drug therapy: Secondary | ICD-10-CM | POA: Insufficient documentation

## 2016-03-27 DIAGNOSIS — F17200 Nicotine dependence, unspecified, uncomplicated: Secondary | ICD-10-CM | POA: Insufficient documentation

## 2016-03-27 NOTE — Discharge Instructions (Signed)
You appear to have a strain to the quadriceps muscle at the top of the kneecap. Wear the ace bandage for comfort. Apply ice to reduce symptoms. Take OTC ibuprofen (3-4 tabs per dose) for pain and inflammation. Follow-up with Dr. Joice LoftsPoggi for continued symptoms.

## 2016-03-27 NOTE — ED Triage Notes (Signed)
Pt states he slipped on on a wet floor and injured his right knee Sunday.. States he needs a note to return to work..Marland Kitchen

## 2016-03-27 NOTE — ED Notes (Signed)
Right knee pain. No apparent injury. Pt wearing 2.5 lb ankle weight on left.

## 2016-03-29 NOTE — ED Provider Notes (Signed)
[facility] Emergency Department Provider Note ____________________________________________  Time seen: 1530  I have reviewed the triage vital signs and the nursing notes.  HISTORY  Chief Complaint  Knee Pain  HPI Evan EmeryMalcolm Q Darrington Jr. is a 26 y.o. male presents to the ED, for evaluation of pain to his right knee. The patient describes on Sunday slipped on a watery floor, at home. He describes "twisting" his right knee. He denies any flow father onto the knee. He has been dosing ibuprofen with 1 tab dosed on the day of the injury. He describes his knee "popped" twice at home yesterday. He then took 2 Tylenol tablets for pain relief today. He denies any pain to the knee at rest. He localizes pain to the top of the kneecap and describes a pop with transitioning from sit to stand. He denies any history of ongoing or chronic knee pain or prior injury.  Past Medical History:  Diagnosis Date  . Asthma     There are no active problems to display for this patient.   Past Surgical History:  Procedure Laterality Date  . FRACTURE SURGERY     left ankle    Prior to Admission medications   Medication Sig Start Date End Date Taking? Authorizing Provider  HYDROcodone-acetaminophen (NORCO/VICODIN) 5-325 MG tablet Take 1 tablet by mouth every 4 (four) hours as needed for moderate pain. 11/13/15   Tommi Rumpshonda L Summers, PA-C  ibuprofen (ADVIL,MOTRIN) 600 MG tablet Take 1 tablet (600 mg total) by mouth every 8 (eight) hours as needed. 11/13/15   Tommi Rumpshonda L Summers, PA-C  sucralfate (CARAFATE) 1 g tablet Take 1 tablet (1 g total) by mouth 4 (four) times daily. 02/23/15   Sharman CheekPhillip Stafford, MD    Allergies Patient has no known allergies.  No family history on file.  Social History Social History  Substance Use Topics  . Smoking status: Current Every Day Smoker  . Smokeless tobacco: Never Used  . Alcohol use No    Review of Systems  Constitutional: Negative for  fever. Cardiovascular: Negative for chest pain. Respiratory: Negative for shortness of breath. Musculoskeletal: Negative for back pain. Right knee pain as above. Skin: Negative for rash. Neurological: Negative for headaches, focal weakness or numbness. ____________________________________________  PHYSICAL EXAM:  VITAL SIGNS: ED Triage Vitals [03/27/16 1505]  Enc Vitals Group     BP 118/62     Pulse Rate 64     Resp 16     Temp 98.6 F (37 C)     Temp Source Oral     SpO2 99 %     Weight      Height      Head Circumference      Peak Flow      Pain Score 6     Pain Loc      Pain Edu?      Excl. in GC?     Constitutional: Alert and oriented. Well appearing and in no distress. Head: Normocephalic and atraumatic. Cardiovascular: Normal rate, regular rhythm. Normal distal pulses. Respiratory: Normal respiratory effort. Musculoskeletal: Right knee without obvious deformity, dislocation, or effusion. Patient is tender to palpation at the quadriceps insertion to the patella. Patient with normal knee range of motion without patella laxity or ballottement. No significant valgus or varus joint stress is noted. Negative drawer sign. No popliteal space fullness is appreciated. No calf or Achilles tenderness is noted. Nontender with normal range of motion in all other extremities.  Neurologic:  Normal gait without  ataxia. Normal speech and language. No gross focal neurologic deficits are appreciated. Skin:  Skin is warm, dry and intact. No rash noted. ____________________________________________   RADIOLOGY  deferred ____________________________________________  PROCEDURES  Ace bandage ____________________________________________  INITIAL IMPRESSION / ASSESSMENT AND PLAN / ED COURSE  Patient with tenderness consistent with a right knee strain and probable quadriceps muscle strain on the right. He is placed in an Ace wrap for comfort at this time. No indication for radiology  imaging at this time. He is discharged with instructions to ice and/or heat the knee and quadriceps as directed. He should continue to monitor symptoms and attempted some home exercises. He is referred to orthopedics for ongoing symptom management.Work note is provided for today as requested. ____________________________________________  FINAL CLINICAL IMPRESSION(S) / ED DIAGNOSES  Final diagnoses:  Strain of right knee, initial encounter  Quadriceps muscle strain, right, initial encounter      Lissa Hoard, PA-C 03/29/16 0017    Arnaldo Natal, MD 03/29/16 1438

## 2018-03-24 ENCOUNTER — Emergency Department
Admission: EM | Admit: 2018-03-24 | Discharge: 2018-03-24 | Disposition: A | Discharge status: Home / Self Care | Payer: Self-pay | Attending: Student in an Organized Health Care Education/Training Program | Admitting: Student in an Organized Health Care Education/Training Program

## 2018-03-24 ENCOUNTER — Encounter: Payer: Self-pay | Admitting: Emergency Medicine

## 2018-03-24 ENCOUNTER — Emergency Department: Payer: Self-pay

## 2018-03-24 DIAGNOSIS — Z79899 Other long term (current) drug therapy: Secondary | ICD-10-CM | POA: Insufficient documentation

## 2018-03-24 DIAGNOSIS — F17200 Nicotine dependence, unspecified, uncomplicated: Secondary | ICD-10-CM | POA: Insufficient documentation

## 2018-03-24 DIAGNOSIS — R112 Nausea with vomiting, unspecified: Secondary | ICD-10-CM

## 2018-03-24 DIAGNOSIS — J45909 Unspecified asthma, uncomplicated: Secondary | ICD-10-CM | POA: Insufficient documentation

## 2018-03-24 DIAGNOSIS — R69 Illness, unspecified: Secondary | ICD-10-CM

## 2018-03-24 DIAGNOSIS — J111 Influenza due to unidentified influenza virus with other respiratory manifestations: Secondary | ICD-10-CM

## 2018-03-24 LAB — COMPREHENSIVE METABOLIC PANEL
ALK PHOS: 61 U/L (ref 38–126)
ALT: 21 U/L (ref 0–44)
AST: 21 U/L (ref 15–41)
Albumin: 4 g/dL (ref 3.5–5.0)
Anion gap: 7 (ref 5–15)
BUN: 10 mg/dL (ref 6–20)
CO2: 27 mmol/L (ref 22–32)
CREATININE: 1 mg/dL (ref 0.61–1.24)
Calcium: 9.3 mg/dL (ref 8.9–10.3)
Chloride: 107 mmol/L (ref 98–111)
GFR calc Af Amer: >60 mL/min (ref >60)
Glucose, Bld: 90 mg/dL (ref 70–99)
Potassium: 4.2 mmol/L (ref 3.5–5.1)
Sodium: 141 mmol/L (ref 135–145)
TOTAL PROTEIN: 6.9 g/dL (ref 6.5–8.1)
Total Bilirubin: 1.8 mg/dL — ABNORMAL HIGH (ref 0.3–1.2)

## 2018-03-24 LAB — CBC
HCT: 40.9 % (ref 39.0–52.0)
Hemoglobin: 13.3 g/dL (ref 13.0–17.0)
MCH: 27.5 pg (ref 26.0–34.0)
MCHC: 32.5 g/dL (ref 30.0–36.0)
MCV: 84.7 fL (ref 80.0–100.0)
Platelets: 238 10*3/uL (ref 150–400)
RBC: 4.83 MIL/uL (ref 4.22–5.81)
RDW: 13.6 % (ref 11.5–15.5)
WBC: 6.5 10*3/uL (ref 4.0–10.5)
nRBC: 0 % (ref 0.0–0.2)

## 2018-03-24 LAB — LIPASE, BLOOD: Lipase: 20 U/L (ref 11–51)

## 2018-03-24 MED ORDER — OSELTAMIVIR PHOSPHATE 75 MG PO CAPS: 75.0000 mg | ORAL_CAPSULE | Freq: Two times a day (BID) | ORAL | 0 refills | Status: AC

## 2018-03-24 MED ORDER — ONDANSETRON HCL 4 MG PO TABS: 4.0000 mg | ORAL_TABLET | Freq: Every day | ORAL | 0 refills | Status: DC | PRN

## 2018-03-24 MED ORDER — SUCRALFATE 1 G PO TABS
1.0000 g | ORAL_TABLET | Freq: Once | ORAL | Status: AC
  Administered 2018-03-24: 1 g via ORAL
  Filled 2018-03-24 (×2): qty 1

## 2018-03-24 MED ORDER — ONDANSETRON 4 MG PO TBDP
4.0000 mg | ORAL_TABLET | Freq: Once | ORAL | Status: AC
  Administered 2018-03-24: 4 mg via ORAL
  Filled 2018-03-24: qty 1

## 2018-03-24 NOTE — ED Triage Notes (Signed)
Pt reports yesterday felt fine and he ate something and this am he woke up felt nauseated and vomited x's 2. Pt states the emesis had bright red blood in it. Pt c/o pain to his chest on the right side. Pt reports pain in chest is constant and stabbing in nature.

## 2018-03-24 NOTE — ED Provider Notes (Signed)
[facility] Emergency Department Provider Note    First MD Initiated Contact with Patient 03/24/18 1201     (approximate)  I have reviewed the triage vital signs and the nursing notes.   HISTORY  Chief Complaint Nausea and Emesis    HPI Evan Leblanc. is a 28 y.o. male plosive past medical history presents the ER for evaluation of nausea vomiting and 2 episodes of streaky red blood in his emesis as well as right-sided chest pain.  Patient denies any diarrhea.  States he is been feeling "hot and cold ".  Denies any shortness of breath.  Has been around several coworkers who have been diagnosed with the flu.    Past Medical History:  Diagnosis Date  . Asthma    No family history on file. Past Surgical History:  Procedure Laterality Date  . FRACTURE SURGERY     left ankle   There are no active problems to display for this patient.     Prior to Admission medications   Medication Sig Start Date End Date Taking? Authorizing Provider  HYDROcodone-acetaminophen (NORCO/VICODIN) 5-325 MG tablet Take 1 tablet by mouth every 4 (four) hours as needed for moderate pain. 11/13/15   Tommi Rumps, PA-C  ibuprofen (ADVIL,MOTRIN) 600 MG tablet Take 1 tablet (600 mg total) by mouth every 8 (eight) hours as needed. 11/13/15   Tommi Rumps, PA-C  ondansetron (ZOFRAN) 4 MG tablet Take 1 tablet (4 mg total) by mouth daily as needed for nausea or vomiting. 03/24/18 03/24/19  Willy Eddy, MD  oseltamivir (TAMIFLU) 75 MG capsule Take 1 capsule (75 mg total) by mouth 2 (two) times daily for 5 days. 03/24/18 03/29/18  Willy Eddy, MD  sucralfate (CARAFATE) 1 g tablet Take 1 tablet (1 g total) by mouth 4 (four) times daily. 02/23/15   Sharman Cheek, MD    Allergies Patient has no known allergies.    Social History Social History   Tobacco Use  . Smoking status: Current Every Day Smoker  . Smokeless tobacco: Never Used  Substance Use Topics   . Alcohol use: No  . Drug use: No    Review of Systems Patient denies headaches, rhinorrhea, blurry vision, numbness, shortness of breath, chest pain, edema, cough, abdominal pain, nausea, vomiting, diarrhea, dysuria, fevers, rashes or hallucinations unless otherwise stated above in HPI. ____________________________________________   PHYSICAL EXAM:  VITAL SIGNS: Vitals:   03/24/18 0930 03/24/18 1124  BP: 126/78 111/87  Pulse: (!) 54 (!) 54  Resp: 20 18  Temp:  98.4 F (36.9 C)  SpO2: 99% 100%    Constitutional: Alert and oriented.  Eyes: Conjunctivae are normal.  Head: Atraumatic. Nose: No congestion/rhinnorhea. Mouth/Throat: Mucous membranes are moist.   Neck: No stridor. Painless ROM.  Cardiovascular: Normal rate, regular rhythm. Grossly normal heart sounds.  Good peripheral circulation. Respiratory: Normal respiratory effort.  No retractions. Lungs CTAB. Gastrointestinal: Soft and nontender. No distention. No abdominal bruits. No CVA tenderness. Genitourinary:  Musculoskeletal: No lower extremity tenderness nor edema.  No joint effusions. Neurologic:  Normal speech and language. No gross focal neurologic deficits are appreciated. No facial droop Skin:  Skin is warm, dry and intact. No rash noted. Psychiatric: Mood and affect are normal. Speech and behavior are normal.  ____________________________________________   LABS (all labs ordered are listed, but only abnormal results are displayed)  Results for orders placed or performed during the hospital encounter of 03/24/18 (from the past 24 hour(s))  Lipase, blood  Status: None   Collection Time: 03/24/18  9:34 AM  Result Value Ref Range   Lipase 20 11 - 51 U/L  Comprehensive metabolic panel     Status: Abnormal   Collection Time: 03/24/18  9:34 AM  Result Value Ref Range   Sodium 141 135 - 145 mmol/L   Potassium 4.2 3.5 - 5.1 mmol/L   Chloride 107 98 - 111 mmol/L   CO2 27 22 - 32 mmol/L   Glucose, Bld 90 70  - 99 mg/dL   BUN 10 6 - 20 mg/dL   Creatinine, Ser 1.61 0.61 - 1.24 mg/dL   Calcium 9.3 8.9 - 09.6 mg/dL   Total Protein 6.9 6.5 - 8.1 g/dL   Albumin 4.0 3.5 - 5.0 g/dL   AST 21 15 - 41 U/L   ALT 21 0 - 44 U/L   Alkaline Phosphatase 61 38 - 126 U/L   Total Bilirubin 1.8 (H) 0.3 - 1.2 mg/dL   GFR calc non Af Amer >60 >60 mL/min   GFR calc Af Amer >60 >60 mL/min   Anion gap 7 5 - 15  CBC     Status: None   Collection Time: 03/24/18  9:34 AM  Result Value Ref Range   WBC 6.5 4.0 - 10.5 K/uL   RBC 4.83 4.22 - 5.81 MIL/uL   Hemoglobin 13.3 13.0 - 17.0 g/dL   HCT 04.5 40.9 - 81.1 %   MCV 84.7 80.0 - 100.0 fL   MCH 27.5 26.0 - 34.0 pg   MCHC 32.5 30.0 - 36.0 g/dL   RDW 91.4 78.2 - 95.6 %   Platelets 238 150 - 400 K/uL   nRBC 0.0 0.0 - 0.2 %   ____________________________________________  EKG My review and personal interpretation at Time: 9:36   Indication: N/V  Rate: 55  Rhythm: sinus Axis: normal Other: BER, no stemi ____________________________________________  RADIOLOGY  I personally reviewed all radiographic images ordered to evaluate for the above acute complaints and reviewed radiology reports and findings.  These findings were personally discussed with the patient.  Please see medical record for radiology report.  ____________________________________________   PROCEDURES  Procedure(s) performed:  Procedures    Critical Care performed: no ____________________________________________   INITIAL IMPRESSION / ASSESSMENT AND PLAN / ED COURSE  Pertinent labs & imaging results that were available during my care of the patient were reviewed by me and considered in my medical decision making (see chart for details).   DDX: Enteritis, gastritis, Mallory-Weiss tear, U GIB, pneumonia, mediastinitis, pneumothorax  Evan Leblanc. is a 28 y.o. who presents to the ED with symptoms as described above.  Patient well-appearing and nontoxic.  He is hemodynamically stable.   Right-sided chest pain is reproducible on exam.  His abdominal exam is soft and benign.  Do suspect flulike illness given his sick contacts.  Will give antiemetic as well as Carafate.  Will check x-ray to evaluate for mediastinal widening as well as pneumonia or ask for pneumonia.  Clinical Course as of Mar 25 1307  Mon Mar 24, 2018  1300 Blood work is reassuring.  He remains hemodynamically stable.  Will treat for flulike illness.  Tolerating oral hydration is stable and appropriate for outpatient follow-up.   [PR]    Clinical Course User Index [PR] Willy Eddy, MD     As part of my medical decision making, I reviewed the following data within the electronic MEDICAL RECORD NUMBER Nursing notes reviewed and incorporated, Labs reviewed, notes from  prior ED visits and [place] Controlled Substance Database   ____________________________________________   FINAL CLINICAL IMPRESSION(S) / ED DIAGNOSES  Final diagnoses:  Non-intractable vomiting with nausea, unspecified vomiting type  Influenza-like illness      NEW MEDICATIONS STARTED DURING THIS VISIT:  New Prescriptions   ONDANSETRON (ZOFRAN) 4 MG TABLET    Take 1 tablet (4 mg total) by mouth daily as needed for nausea or vomiting.   OSELTAMIVIR (TAMIFLU) 75 MG CAPSULE    Take 1 capsule (75 mg total) by mouth 2 (two) times daily for 5 days.     Note:  This document was prepared using Dragon voice recognition software and may include unintentional dictation errors.    Willy Eddy, MD 03/24/18 1309

## 2018-03-24 NOTE — ED Triage Notes (Signed)
Pt in via EMS from home with flu-likes sx's since yesterday. EMS reports pt with bodyaches and chills. EMS reports pt vomited x's 2 and had bright red blood and gets lightheaded when he stands. Pt c/o pain to chest right side. Pt with #18 gauge to right AC. Received 500 cc's of NS. 98.4 oral temp

## 2018-03-28 ENCOUNTER — Emergency Department
Admission: EM | Admit: 2018-03-28 | Discharge: 2018-03-28 | Disposition: A | Discharge status: Home / Self Care | Payer: Medicaid Other | Attending: Emergency Medicine | Admitting: Emergency Medicine

## 2018-03-28 ENCOUNTER — Emergency Department: Payer: Medicaid Other

## 2018-03-28 ENCOUNTER — Other Ambulatory Visit: Payer: Self-pay

## 2018-03-28 DIAGNOSIS — J111 Influenza due to unidentified influenza virus with other respiratory manifestations: Secondary | ICD-10-CM | POA: Insufficient documentation

## 2018-03-28 DIAGNOSIS — L738 Other specified follicular disorders: Secondary | ICD-10-CM

## 2018-03-28 DIAGNOSIS — F17210 Nicotine dependence, cigarettes, uncomplicated: Secondary | ICD-10-CM | POA: Insufficient documentation

## 2018-03-28 DIAGNOSIS — K920 Hematemesis: Secondary | ICD-10-CM

## 2018-03-28 DIAGNOSIS — Z79899 Other long term (current) drug therapy: Secondary | ICD-10-CM | POA: Insufficient documentation

## 2018-03-28 DIAGNOSIS — J45909 Unspecified asthma, uncomplicated: Secondary | ICD-10-CM | POA: Insufficient documentation

## 2018-03-28 DIAGNOSIS — L731 Pseudofolliculitis barbae: Secondary | ICD-10-CM | POA: Insufficient documentation

## 2018-03-28 LAB — BASIC METABOLIC PANEL
Anion gap: 7 (ref 5–15)
BUN: 11 mg/dL (ref 6–20)
CHLORIDE: 106 mmol/L (ref 98–111)
CO2: 27 mmol/L (ref 22–32)
CREATININE: 0.97 mg/dL (ref 0.61–1.24)
Calcium: 8.9 mg/dL (ref 8.9–10.3)
GFR calc Af Amer: >60 mL/min (ref >60)
GFR calc non Af Amer: >60 mL/min (ref >60)
GLUCOSE: 89 mg/dL (ref 70–99)
POTASSIUM: 3.8 mmol/L (ref 3.5–5.1)
SODIUM: 140 mmol/L (ref 135–145)

## 2018-03-28 LAB — CBC
HEMATOCRIT: 41 % (ref 39.0–52.0)
HEMOGLOBIN: 13.2 g/dL (ref 13.0–17.0)
MCH: 27.6 pg (ref 26.0–34.0)
MCHC: 32.2 g/dL (ref 30.0–36.0)
MCV: 85.8 fL (ref 80.0–100.0)
Platelets: 248 10*3/uL (ref 150–400)
RBC: 4.78 MIL/uL (ref 4.22–5.81)
RDW: 13.5 % (ref 11.5–15.5)
WBC: 8.3 10*3/uL (ref 4.0–10.5)
nRBC: 0 % (ref 0.0–0.2)

## 2018-03-28 LAB — TROPONIN I: Troponin I: <0.03 ng/mL (ref <0.03)

## 2018-03-28 MED ORDER — IBUPROFEN 600 MG PO TABS: 600.0000 mg | ORAL_TABLET | Freq: Three times a day (TID) | ORAL | 0 refills | Status: DC | PRN

## 2018-03-28 MED ORDER — SULFAMETHOXAZOLE-TRIMETHOPRIM 800-160 MG PO TABS
1.0000 | ORAL_TABLET | Freq: Once | ORAL | Status: AC
  Administered 2018-03-28: 1 via ORAL
  Filled 2018-03-28: qty 1

## 2018-03-28 MED ORDER — HYDROCOD POLST-CPM POLST ER 10-8 MG/5ML PO SUER: 5.0000 mL | Freq: Two times a day (BID) | ORAL | 0 refills | Status: DC

## 2018-03-28 MED ORDER — SULFAMETHOXAZOLE-TRIMETHOPRIM 800-160 MG PO TABS: 1.0000 | ORAL_TABLET | Freq: Two times a day (BID) | ORAL | 0 refills | Status: DC

## 2018-03-28 MED ORDER — SODIUM CHLORIDE 0.9% FLUSH: 3.0000 mL | Freq: Once | INTRAVENOUS | Status: DC

## 2018-03-28 MED ORDER — LIDOCAINE 5 % EX PTCH
1.0000 | MEDICATED_PATCH | CUTANEOUS | Status: DC
  Administered 2018-03-28: 1 via TRANSDERMAL
  Filled 2018-03-28: qty 1

## 2018-03-28 MED ORDER — HYDROCOD POLST-CPM POLST ER 10-8 MG/5ML PO SUER
5.0000 mL | Freq: Once | ORAL | Status: AC
  Administered 2018-03-28: 5 mL via ORAL
  Filled 2018-03-28: qty 5

## 2018-03-28 NOTE — Discharge Instructions (Signed)
Continue previous medications.  Start Tussionex and Bactrim DS as directed.  Advised over-the-counter Tylenol at 650 mg every 4-6 hours for body aches/fever.  Call the GI clinic today to schedule appointment.

## 2018-03-28 NOTE — ED Provider Notes (Signed)
[facility] Emergency Department Provider Note   ____________________________________________   First MD Initiated Contact with Patient 03/28/18 1208     (approximate)  I have reviewed the triage vital signs and the nursing notes.   HISTORY  Chief Complaint Abscess; Chest Pain; and Cough    HPI Evan Leblanc. is a 28 y.o. male patient presents for multiple complaints today.  Patient was seen earlier this week for flulike symptoms states not getting better.  Patient history is vague because I see a prescription for Tamiflu but he was told that not to take the Tamiflu.  Patient also has edema to the right lateral mandible area.  Patient also state he is continued to nausea and vomiting with blood.  Patient states the vomitus is blood-streaked.  Patient also complain of right upper anterior chest wall pain.  Patient described the pain is "stabbing".  Patient rates his overall pain as a 10/10.  Patient state he is continue taking Zofran and Carafate.  Past Medical History:  Diagnosis Date  . Asthma     There are no active problems to display for this patient.   Past Surgical History:  Procedure Laterality Date  . FRACTURE SURGERY     left ankle    Prior to Admission medications   Medication Sig Start Date End Date Taking? Authorizing Provider  chlorpheniramine-HYDROcodone (TUSSIONEX PENNKINETIC ER) 10-8 MG/5ML SUER Take 5 mLs by mouth 2 (two) times daily. 03/28/18   Joni Reining, PA-C  HYDROcodone-acetaminophen (NORCO/VICODIN) 5-325 MG tablet Take 1 tablet by mouth every 4 (four) hours as needed for moderate pain. 11/13/15   Tommi Rumps, PA-C  ibuprofen (ADVIL,MOTRIN) 600 MG tablet Take 1 tablet (600 mg total) by mouth every 8 (eight) hours as needed. 11/13/15   Tommi Rumps, PA-C  ibuprofen (ADVIL,MOTRIN) 600 MG tablet Take 1 tablet (600 mg total) by mouth every 8 (eight) hours as needed. 03/28/18   Joni Reining, PA-C  ondansetron  [facility]) 4 MG tablet Take 1 tablet (4 mg total) by mouth daily as needed for nausea or vomiting. 03/24/18 03/24/19  Willy Eddy, MD  oseltamivir (TAMIFLU) 75 MG capsule Take 1 capsule (75 mg total) by mouth 2 (two) times daily for 5 days. 03/24/18 03/29/18  Willy Eddy, MD  sucralfate (CARAFATE) 1 g tablet Take 1 tablet (1 g total) by mouth 4 (four) times daily. 02/23/15   Sharman Cheek, MD  sulfamethoxazole-trimethoprim (BACTRIM DS,SEPTRA DS) 800-160 MG tablet Take 1 tablet by mouth 2 (two) times daily. 03/28/18   Joni Reining, PA-C    Allergies Patient has no known allergies.  No family history on file.  Social History Social History   Tobacco Use  . Smoking status: Current Every Day Smoker  . Smokeless tobacco: Never Used  Substance Use Topics  . Alcohol use: No  . Drug use: No    Review of Systems  Constitutional: No fever/chills Eyes: No visual changes. ENT: No sore throat. Cardiovascular: Denies chest pain. Respiratory: Denies shortness of breath. Gastrointestinal: No abdominal pain.  Nausea and vomiting with blood-tinged vomitus.  No diarrhea.  No constipation. Genitourinary: Negative for dysuria. Musculoskeletal: Right upper anterior chest wall pain.  Skin: Nodular lesion on erythematous base right mandible.  Neurological: Negative for headaches, focal weakness or numbness.   ____________________________________________   PHYSICAL EXAM:  VITAL SIGNS: ED Triage Vitals  Enc Vitals Group     BP 03/28/18 1041 122/60     Pulse Rate 03/28/18 1041 (!)  55     Resp 03/28/18 1041 17     Temp 03/28/18 1041 98.6 F (37 C)     Temp Source 03/28/18 1041 Oral     SpO2 03/28/18 1041 99 %     Weight 03/28/18 1042 165 lb (74.8 kg)     Height 03/28/18 1042  (1.854 m)     Head Circumference --      Peak Flow --      Pain Score 03/28/18 1041 10     Pain Loc --      Pain Edu? --      Excl. in GC? --     Constitutional: Alert and oriented. Well appearing  and in no acute distress. Eyes: Conjunctivae are normal. PERRL. EOMI. Head: Atraumatic. Nose: No congestion/rhinnorhea. Mouth/Throat: Mucous membranes are moist.  Oropharynx non-erythematous. Neck: No stridor. Hematological/Lymphatic/Immunilogical: No cervical lymphadenopathy. Cardiovascular: Normal rate, regular rhythm. Grossly normal heart sounds.  Good peripheral circulation. Respiratory: Normal respiratory effort.  No retractions. Lungs CTAB. Gastrointestinal: Soft and nontender. No distention. No abdominal bruits. No CVA tenderness. Neurologic:  Normal speech and language. No gross focal neurologic deficits are appreciated. No gait instability. Skin:  Skin is warm, dry and intact.  Nodule lesion on erythematous base right lateral mandible area. Psychiatric: Mood and affect are normal. Speech and behavior are normal.  ____________________________________________   LABS (all labs ordered are listed, but only abnormal results are displayed)  Labs Reviewed  BASIC METABOLIC PANEL  CBC  TROPONIN I   ____________________________________________  EKG EKG read by heart station Dr. with no acute findings. ____________________________________________  RADIOLOGY  ED MD interpretation:  Official radiology report(s): Dg Chest 2 View  Result Date: 03/28/2018 CLINICAL DATA:  Cough for the past week.  Flu symptoms. EXAM: CHEST - 2 VIEW COMPARISON:  PA and lateral chest 03/24/2018 and 06/03/2013. FINDINGS: The lungs are clear. Heart size is normal. There is no pneumothorax or pleural fluid. No bony abnormality. IMPRESSION: Normal chest. Electronically Signed   By: Drusilla Kanner M.D.   On: 03/28/2018 11:35    ____________________________________________   PROCEDURES  Procedure(s) performed (including Critical Care):  Procedures   ____________________________________________   INITIAL IMPRESSION / ASSESSMENT AND PLAN / ED COURSE  As part of my medical decision making, I  reviewed the following data within the electronic MEDICAL RECORD NUMBER     Patient presents for continued viral signs and symptoms from last visit.  Patient facial swelling is secondary to folliculitis.  No active vomiting since arrival.  Discussed negative lab and x-ray findings with patient.  Discussed EKG reading.  Patient physical finding consistent with continued viral illness, folliculitis barbae, and hematemesis.  Patient is hemodynamic stable and advised to consult to GI will be appropriate at this time.  Patient given discharge care instruction work note.  Patient advised continue previous medication and start Bactrim DS and Tussionex.  Follow-up with PCP.      ____________________________________________   FINAL CLINICAL IMPRESSION(S) / ED DIAGNOSES  Final diagnoses:  Hematemesis of unknown cause  Flu syndrome  Folliculitis barbae     ED Discharge Orders         Ordered    chlorpheniramine-HYDROcodone (TUSSIONEX PENNKINETIC ER) 10-8 MG/5ML SUER  2 times daily     03/28/18 1245    sulfamethoxazole-trimethoprim (BACTRIM DS,SEPTRA DS) 800-160 MG tablet  2 times daily     03/28/18 1245    ibuprofen (ADVIL,MOTRIN) 600 MG tablet  Every 8 hours PRN     03/28/18  1245           Note:  This document was prepared using Dragon voice recognition software and may include unintentional dictation errors.    Joni Reining, PA-C 03/28/18 1256    Jeanmarie Plant, MD 03/28/18 1535

## 2018-03-28 NOTE — ED Triage Notes (Signed)
Pt has multiple complaints, pt c/o cough for the past week, states he was told to treat for flu like sx, states not getting any better. Pt c/o swelling and pain to the right jaw with redness noted on arrival. Pt also c/o constant stabbing chest pain . Denies pain with breathing. Pt is in NAD at present.

## 2018-03-28 NOTE — ED Notes (Signed)

## 2018-04-21 ENCOUNTER — Emergency Department: Payer: Self-pay

## 2018-04-21 ENCOUNTER — Emergency Department
Admission: EM | Admit: 2018-04-21 | Discharge: 2018-04-21 | Disposition: A | Discharge status: Home / Self Care | Payer: Self-pay | Attending: Emergency Medicine | Admitting: Emergency Medicine

## 2018-04-21 ENCOUNTER — Encounter: Payer: Self-pay | Admitting: Emergency Medicine

## 2018-04-21 ENCOUNTER — Other Ambulatory Visit: Payer: Self-pay

## 2018-04-21 DIAGNOSIS — M79641 Pain in right hand: Secondary | ICD-10-CM | POA: Insufficient documentation

## 2018-04-21 DIAGNOSIS — F17200 Nicotine dependence, unspecified, uncomplicated: Secondary | ICD-10-CM | POA: Insufficient documentation

## 2018-04-21 DIAGNOSIS — J45909 Unspecified asthma, uncomplicated: Secondary | ICD-10-CM | POA: Insufficient documentation

## 2018-04-21 MED ORDER — TRAMADOL HCL 50 MG PO TABS: 50.0000 mg | ORAL_TABLET | Freq: Four times a day (QID) | ORAL | 0 refills | Status: DC | PRN

## 2018-04-21 MED ORDER — NAPROXEN 500 MG PO TABS: 500.0000 mg | ORAL_TABLET | Freq: Two times a day (BID) | ORAL | Status: DC

## 2018-04-21 NOTE — ED Notes (Signed)
See triage note  Presents with right hand pain  States he was lifting boxes at work on Saturday  Last box was heavier  Felt pain to palm of hand and into wrist  Min swelling good pulses

## 2018-04-21 NOTE — Discharge Instructions (Signed)
Wear splint for 5-7 days when not working.

## 2018-04-21 NOTE — ED Notes (Signed)
Pt signed printed d/c paperwork. [place]az frozen.  

## 2018-04-21 NOTE — ED Triage Notes (Signed)
R hand pain since moving box 2 days ago.

## 2018-04-21 NOTE — ED Notes (Signed)
NT placing split with this RN assisting.

## 2018-04-21 NOTE — ED Provider Notes (Signed)
[facility] Emergency Department Provider Note   ____________________________________________   First MD Initiated Contact with Patient 04/21/18 1504     (approximate)  I have reviewed the triage vital signs and the nursing notes.   HISTORY  Chief Complaint Hand Pain    HPI Evan Leblanc. is a 28 y.o. male patient complain of right hand pain for 2 days.  Onset of complaint status post lifting heavy boxes at work.  Patient denies loss of sensation.  Patient did pain increased with extension of the fingers and flexion of the wrist.  Patient rates the pain as a 7/10.  Patient described the pain is "achy".  No palliative measure for complaint.  Patient is right-hand dominant.         Past Medical History:  Diagnosis Date  . Asthma     There are no active problems to display for this patient.   Past Surgical History:  Procedure Laterality Date  . FRACTURE SURGERY     left ankle    Prior to Admission medications   Medication Sig Start Date End Date Taking? Authorizing Provider  naproxen (NAPROSYN) 500 MG tablet Take 1 tablet (500 mg total) by mouth 2 (two) times daily with a meal. 04/21/18   Joni Reining, PA-C  traMADol (ULTRAM) 50 MG tablet Take 1 tablet (50 mg total) by mouth every 6 (six) hours as needed. 04/21/18   Joni Reining, PA-C    Allergies Patient has no known allergies.  No family history on file.  Social History Social History   Tobacco Use  . Smoking status: Current Every Day Smoker  . Smokeless tobacco: Never Used  Substance Use Topics  . Alcohol use: No  . Drug use: No    Review of Systems Constitutional: No fever/chills Eyes: No visual changes. ENT: No sore throat. Cardiovascular: Denies chest pain. Respiratory: Denies shortness of breath. Gastrointestinal: No abdominal pain.  No nausea, no vomiting.  No diarrhea.  No constipation. Genitourinary: Negative for dysuria. Musculoskeletal: Positive for  right hand pain.   Skin: Negative for rash. Neurological: Negative for headaches, focal weakness or numbness.   ____________________________________________   PHYSICAL EXAM:  VITAL SIGNS: ED Triage Vitals  Enc Vitals Group     BP 04/21/18 1454 (!) 144/91     Pulse Rate 04/21/18 1454 (!) 55     Resp 04/21/18 1454 18     Temp 04/21/18 1454 98.2 F (36.8 C)     Temp Source 04/21/18 1454 Oral     SpO2 04/21/18 1454 99 %     Weight 04/21/18 1455 165 lb (74.8 kg)     Height 04/21/18 1455 6\' 1"  (1.854 m)     Head Circumference --      Peak Flow --      Pain Score 04/21/18 1455 10     Pain Loc --      Pain Edu? --      Excl. in GC? --    Constitutional: Alert and oriented. Well appearing and in no acute distress. Cardiovascular: Normal rate, regular rhythm. Grossly normal heart sounds.  Good peripheral circulation. Respiratory: Normal respiratory effort.  No retractions. Lungs CTAB. Musculoskeletal: No obvious deformity to the right hand.  No edema or ecchymosis.  Patient decreased range of motion with flexion and extension of the right wrist.   Neurologic:  Normal speech and language. No gross focal neurologic deficits are appreciated. No gait instability. Skin:  Skin is warm, dry and  intact. No rash noted. Psychiatric: Mood and affect are normal. Speech and behavior are normal.  ____________________________________________   LABS (all labs ordered are listed, but only abnormal results are displayed)  Labs Reviewed - No data to display ____________________________________________  EKG   ____________________________________________  RADIOLOGY  ED MD interpretation:    Official radiology report(s): Dg Hand 2 View Right  Result Date: 04/21/2018 CLINICAL DATA:  Right hand pain after lifting injury 2 days ago. EXAM: RIGHT HAND - 2 VIEW COMPARISON:  Radiographs of August 26, 2014. FINDINGS: There is no evidence of fracture or dislocation. There is no evidence of arthropathy  or other focal bone abnormality. Soft tissues are unremarkable. IMPRESSION: Negative. Electronically Signed   By: Lupita Raider, M.D.   On: 04/21/2018 15:44    ____________________________________________   PROCEDURES  Procedure(s) performed (including Critical Care):  Procedures   ____________________________________________   INITIAL IMPRESSION / ASSESSMENT AND PLAN / ED COURSE  As part of my medical decision making, I reviewed the following data within the electronic MEDICAL RECORD NUMBER         Right hand pain secondary to strain.  Discussed x-ray findings with patient.  Patient placed in a volar splint.  Patient vies take medication as directed.  Patient [facility].      ____________________________________________   FINAL CLINICAL IMPRESSION(S) / ED DIAGNOSES  Final diagnoses:  Right hand pain     ED Discharge Orders         Ordered    traMADol (ULTRAM) 50 MG tablet  Every 6 hours PRN     04/21/18 1601    naproxen (NAPROSYN) 500 MG tablet  2 times daily with meals     04/21/18 1601           Note:  This document was prepared using Dragon voice recognition software and may include unintentional dictation errors.    Joni Reining, PA-C 04/21/18 1611    Sharyn Creamer, MD 04/21/18 2219

## 2018-10-03 ENCOUNTER — Encounter: Payer: Self-pay | Admitting: Emergency Medicine

## 2018-10-03 ENCOUNTER — Other Ambulatory Visit: Payer: Self-pay

## 2018-10-03 ENCOUNTER — Emergency Department
Admission: EM | Admit: 2018-10-03 | Discharge: 2018-10-03 | Disposition: A | Discharge status: Home / Self Care | Payer: Medicaid Other | Attending: Emergency Medicine | Admitting: Emergency Medicine

## 2018-10-03 DIAGNOSIS — Y999 Unspecified external cause status: Secondary | ICD-10-CM | POA: Insufficient documentation

## 2018-10-03 DIAGNOSIS — J45909 Unspecified asthma, uncomplicated: Secondary | ICD-10-CM | POA: Insufficient documentation

## 2018-10-03 DIAGNOSIS — Y939 Activity, unspecified: Secondary | ICD-10-CM | POA: Insufficient documentation

## 2018-10-03 DIAGNOSIS — F17210 Nicotine dependence, cigarettes, uncomplicated: Secondary | ICD-10-CM | POA: Insufficient documentation

## 2018-10-03 DIAGNOSIS — X58XXXA Exposure to other specified factors, initial encounter: Secondary | ICD-10-CM | POA: Insufficient documentation

## 2018-10-03 DIAGNOSIS — Y929 Unspecified place or not applicable: Secondary | ICD-10-CM | POA: Insufficient documentation

## 2018-10-03 DIAGNOSIS — S0502XA Injury of conjunctiva and corneal abrasion without foreign body, left eye, initial encounter: Secondary | ICD-10-CM | POA: Insufficient documentation

## 2018-10-03 MED ORDER — TOBRAMYCIN 0.3 % OP SOLN: 2.0000 [drp] | OPHTHALMIC | 0 refills | Status: DC

## 2018-10-03 MED ORDER — FLUORESCEIN SODIUM 1 MG OP STRP
1.0000 | ORAL_STRIP | Freq: Once | OPHTHALMIC | Status: AC
  Administered 2018-10-03: 1 via OPHTHALMIC
  Filled 2018-10-03: qty 1

## 2018-10-03 MED ORDER — EYE WASH OPHTH SOLN
1.0000 [drp] | OPHTHALMIC | Status: DC | PRN
  Filled 2018-10-03: qty 118

## 2018-10-03 MED ORDER — TETRACAINE HCL 0.5 % OP SOLN
1.0000 [drp] | Freq: Once | OPHTHALMIC | Status: AC
  Administered 2018-10-03: 1 [drp] via OPHTHALMIC
  Filled 2018-10-03: qty 4

## 2018-10-03 NOTE — ED Triage Notes (Signed)
PT arrives with complaints of eye irritation and redness that started "a few days ago."

## 2018-10-03 NOTE — Discharge Instructions (Signed)
Follow-up with [facility] if any continued problems.  Begin using the eyedrops that was sent to your pharmacy every 2 hours while awake.  Avoid being in bright light and wear sunglasses when outside to protect your eye.  You may also take Tylenol or ibuprofen as needed for eye pain.

## 2018-10-03 NOTE — ED Notes (Signed)
See triage note  Presents with pain and drainage to left eye for couple of days

## 2018-10-03 NOTE — ED Provider Notes (Signed)
[facility] Emergency Department Provider Note  ____________________________________________   First MD Initiated Contact with Patient 10/03/18 1416     (approximate)  I have reviewed the triage vital signs and the nursing notes.   HISTORY  Chief Complaint Conjunctivitis   HPI Evan EmeryMalcolm Q Fritzsche Jr. is a 28 y.o. male presents to the ED with complaint of complaint of left eye pain for the last several days.  Patient states that feels like there is something in his eye.  He denies any drainage that is been discolored and states that light bothers his eye greatly.  He is unaware of any actual injury.  He rates his pain as 7 out of 10.     Past Medical History:  Diagnosis Date  . Asthma     There are no active problems to display for this patient.   Past Surgical History:  Procedure Laterality Date  . FRACTURE SURGERY     left ankle    Prior to Admission medications   Medication Sig Start Date End Date Taking? Authorizing Provider  tobramycin (TOBREX) 0.3 % ophthalmic solution Place 2 drops into the left eye every 4 (four) hours. While awake 10/03/18   Tommi RumpsSummers, Santosh Petter L, PA-C    Allergies Patient has no known allergies.  No family history on file.  Social History Social History   Tobacco Use  . Smoking status: Current Every Day Smoker  . Smokeless tobacco: Never Used  Substance Use Topics  . Alcohol use: No  . Drug use: No    Review of Systems Constitutional: No fever/chills Eyes: Positive left eye pain. ENT: No sore throat. Cardiovascular: Denies chest pain. Respiratory: Denies shortness of breath. Musculoskeletal: Negative for back pain. Skin: Negative for rash. Neurological: Negative for headaches, focal weakness or numbness. ____________________________________________   PHYSICAL EXAM:  VITAL SIGNS: ED Triage Vitals  Enc Vitals Group     BP 10/03/18 1346 126/73     Pulse Rate 10/03/18 1346 (!) 52     Resp 10/03/18 1346 18      Temp 10/03/18 1346 98.2 F (36.8 C)     Temp src --      SpO2 10/03/18 1346 100 %     Weight 10/03/18 1347 165 lb 5.5 oz (75 kg)     Height 10/03/18 1347 6\' 1"  (1.854 m)     Head Circumference --      Peak Flow --      Pain Score 10/03/18 1347 7     Pain Loc --      Pain Edu? --      Excl. in GC? --    Constitutional: Alert and oriented. Well appearing and in no acute distress. Eyes: Conjunctiva of the left eye is markedly injected with clear watery drainage especially with looking at the light.  Patient is very photophobic.  PERRL. EOMI. tetracaine was placed in the eye and lid was everted with no foreign body noted.  Fluorescein dye was placed in the eye and there is a superficial abrasion noted at both 3:00 and 6 o'clock position.  Eye was then irrigated with eyewash. Head: Atraumatic. Neck: No stridor.   Cardiovascular:   Good peripheral circulation. Respiratory: Normal respiratory effort.  No retractions. Musculoskeletal: Moves upper and lower extremities no difficulty.  Normal gait was tested. Neurologic:  Normal speech and language. No gross focal neurologic deficits are appreciated. No gait instability. Skin:  Skin is warm, dry and intact. No rash noted. Psychiatric: Mood and affect  are normal. Speech and behavior are normal.  ____________________________________________   LABS (all labs ordered are listed, but only abnormal results are displayed)  Labs Reviewed - No data to display ____________________________________________  PROCEDURES  Procedure(s) performed (including Critical Care):  Procedures   ____________________________________________   INITIAL IMPRESSION / ASSESSMENT AND PLAN / ED COURSE  As part of my medical decision making, I reviewed the following data within the electronic MEDICAL RECORD NUMBER Notes from prior ED visits and [place] Controlled Substance Database  28 year old male presents to the ED with complaint of left eye pain for several days.   He states that there is a foreign body sensation and he is unaware of any actual injury.  Fluorescein stain did show to superficial corneal abrasions at 3:00 and 6:00.  Left eye was irrigated and patient was given a prescription for Tobrex ophthalmic solution to use every 4 hours while awake.  He is to follow-up with [facility] if any continued problems or return to the emergency department if any worsening over the weekend.   ____________________________________________   FINAL CLINICAL IMPRESSION(S) / ED DIAGNOSES  Final diagnoses:  Abrasion of left cornea, initial encounter     ED Discharge Orders         Ordered    tobramycin (TOBREX) 0.3 % ophthalmic solution  Every 4 hours     10/03/18 1540           Note:  This document was prepared using Dragon voice recognition software and may include unintentional dictation errors.    Johnn Hai, PA-C 10/03/18 1721    Earleen Newport, MD 10/08/18 [phone]

## 2018-10-27 ENCOUNTER — Other Ambulatory Visit: Payer: Self-pay

## 2018-10-27 ENCOUNTER — Emergency Department
Admission: EM | Admit: 2018-10-27 | Discharge: 2018-10-28 | Disposition: A | Discharge status: Home / Self Care | Payer: Self-pay | Attending: Emergency Medicine | Admitting: Emergency Medicine

## 2018-10-27 ENCOUNTER — Encounter: Payer: Self-pay | Admitting: Emergency Medicine

## 2018-10-27 ENCOUNTER — Emergency Department: Payer: Self-pay

## 2018-10-27 DIAGNOSIS — Z20822 Contact with and (suspected) exposure to covid-19: Secondary | ICD-10-CM

## 2018-10-27 DIAGNOSIS — F17210 Nicotine dependence, cigarettes, uncomplicated: Secondary | ICD-10-CM | POA: Insufficient documentation

## 2018-10-27 DIAGNOSIS — R509 Fever, unspecified: Secondary | ICD-10-CM | POA: Insufficient documentation

## 2018-10-27 DIAGNOSIS — J45909 Unspecified asthma, uncomplicated: Secondary | ICD-10-CM | POA: Insufficient documentation

## 2018-10-27 DIAGNOSIS — R55 Syncope and collapse: Secondary | ICD-10-CM | POA: Insufficient documentation

## 2018-10-27 DIAGNOSIS — Z20828 Contact with and (suspected) exposure to other viral communicable diseases: Secondary | ICD-10-CM | POA: Insufficient documentation

## 2018-10-27 LAB — CBC WITH DIFFERENTIAL/PLATELET
Abs Immature Granulocytes: 0.01 10*3/uL (ref 0.00–0.07)
Basophils Absolute: 0 10*3/uL (ref 0.0–0.1)
Basophils Relative: 1 %
Eosinophils Absolute: 0.4 10*3/uL (ref 0.0–0.5)
Eosinophils Relative: 4 %
HCT: 39.2 % (ref 39.0–52.0)
Hemoglobin: 13.1 g/dL (ref 13.0–17.0)
Immature Granulocytes: 0 %
Lymphocytes Relative: 29 %
Lymphs Abs: 2.4 10*3/uL (ref 0.7–4.0)
MCH: 28 pg (ref 26.0–34.0)
MCHC: 33.4 g/dL (ref 30.0–36.0)
MCV: 83.8 fL (ref 80.0–100.0)
Monocytes Absolute: 0.9 10*3/uL (ref 0.1–1.0)
Monocytes Relative: 11 %
Neutro Abs: 4.6 10*3/uL (ref 1.7–7.7)
Neutrophils Relative %: 55 %
Platelets: 261 10*3/uL (ref 150–400)
RBC: 4.68 MIL/uL (ref 4.22–5.81)
RDW: 13.3 % (ref 11.5–15.5)
WBC: 8.3 10*3/uL (ref 4.0–10.5)
nRBC: 0 % (ref 0.0–0.2)

## 2018-10-27 LAB — URINALYSIS, COMPLETE (UACMP) WITH MICROSCOPIC
Bacteria, UA: NONE SEEN
Bilirubin Urine: NEGATIVE
Glucose, UA: NEGATIVE mg/dL
Hgb urine dipstick: NEGATIVE
Ketones, ur: NEGATIVE mg/dL
Nitrite: NEGATIVE
Protein, ur: NEGATIVE mg/dL
Specific Gravity, Urine: 1.024 (ref 1.005–1.030)
Squamous Epithelial / HPF: NONE SEEN (ref 0–5)
pH: 6 (ref 5.0–8.0)

## 2018-10-27 MED ORDER — SODIUM CHLORIDE 0.9 % IV BOLUS
1000.0000 mL | Freq: Once | INTRAVENOUS | Status: AC
  Administered 2018-10-27: 1000 mL via INTRAVENOUS

## 2018-10-27 NOTE — ED Provider Notes (Signed)
[facility] Emergency Department Provider Note  ____________________________________________  Time seen: Approximately 11:07 PM  I have reviewed the triage vital signs and the nursing notes.   HISTORY  Chief Complaint Fever, Cough, and Nasal Congestion    HPI Evan Gullett. is a 28 y.o. male who presents the emergency department via EMS from work after a syncopal episode.  Patient reports that yesterday he began to feel "funny."  Patient reports that he went to work today, started feeling lightheaded and dizzy and then passed out.  Patient is unsure whether he hit his head when he fell but denies any headache, visual changes, neck pain, musculoskeletal pain.  Patient denies any fevers or chills, nasal congestion, sore throat.  Patient does endorse a cough that started yesterday.  No nausea, vomiting, diarrhea or constipation.  No urinary symptoms.  Patient denies any medical history other than asthma.  Patient does not take any routine medications for his asthma.           Past Medical History:  Diagnosis Date  . Asthma     There are no active problems to display for this patient.   Past Surgical History:  Procedure Laterality Date  . FRACTURE SURGERY     left ankle    Prior to Admission medications   Medication Sig Start Date End Date Taking? Authorizing Provider  tobramycin (TOBREX) 0.3 % ophthalmic solution Place 2 drops into the left eye every 4 (four) hours. While awake 10/03/18   Johnn Hai, PA-C    Allergies Patient has no known allergies.  No family history on file.  Social History Social History   Tobacco Use  . Smoking status: Current Every Day Smoker    Packs/day: 1.00    Types: Cigarettes  . Smokeless tobacco: Never Used  Substance Use Topics  . Alcohol use: No  . Drug use: No     Review of Systems  Constitutional: No fever/chills.  Positive for syncope Eyes: No visual changes. No discharge ENT: No upper  respiratory complaints. Cardiovascular: no chest pain. Respiratory: Positive cough. No SOB. Gastrointestinal: No abdominal pain.  No nausea, no vomiting.  No diarrhea.  No constipation. Genitourinary: Negative for dysuria. No hematuria Musculoskeletal: Negative for musculoskeletal pain. Skin: Negative for rash, abrasions, lacerations, ecchymosis. Neurological: Negative for headaches, focal weakness or numbness.Marland Kitchen  Positive for syncope. 10-point ROS otherwise negative.  ____________________________________________   PHYSICAL EXAM:  VITAL SIGNS: ED Triage Vitals [10/27/18 2205]  Enc Vitals Group     BP 98/63     Pulse Rate 82     Resp 18     Temp 98.4 F (36.9 C)     Temp Source Oral     SpO2 97 %     Weight 165 lb (74.8 kg)     Height 6\' 1"  (1.854 m)     Head Circumference      Peak Flow      Pain Score 8     Pain Loc      Pain Edu?      Excl. in [place]?      Constitutional: Alert and oriented. Well appearing and in no acute distress. Eyes: Conjunctivae are normal. PERRL. EOMI. Head: Atraumatic.  No gross signs of trauma with abrasions, lacerations, ecchymosis.  No tenderness to palpation of the osseous structures of the skull and face.  No battle signs, raccoon eyes, serosanguineous fluid drainage from the ears or nares. ENT:      Ears:  Nose: No congestion/rhinnorhea.      Mouth/Throat: Mucous membranes are moist.  Neck: No stridor.  No cervical spine tenderness to palpation.  Neck is supple full range of motion. Hematological/Lymphatic/Immunilogical: No cervical lymphadenopathy. Cardiovascular: Normal rate, regular rhythm. Normal S1 and S2.  Good peripheral circulation. Respiratory: Normal respiratory effort without tachypnea or retractions. Lungs CTAB. Good air entry to the bases with no decreased or absent breath sounds. Gastrointestinal: Bowel sounds 4 quadrants. Soft and nontender to palpation. No guarding or rigidity. No palpable masses. No distention. No CVA  tenderness. Musculoskeletal: Full range of motion to all extremities. No gross deformities appreciated. Neurologic:  Normal speech and language. No gross focal neurologic deficits are appreciated.  Cranial nerves II through XII grossly intact.  Negative Romberg's and pronator drift. Skin:  Skin is warm, dry and intact. No rash noted. Psychiatric: Mood and affect are normal. Speech and behavior are normal. Patient exhibits appropriate insight and judgement.   ____________________________________________   LABS (all labs ordered are listed, but only abnormal results are displayed)  Labs Reviewed  SARS CORONAVIRUS 2 (TAT 6-24 HRS)  CBC WITH DIFFERENTIAL/PLATELET  COMPREHENSIVE METABOLIC PANEL  URINALYSIS, COMPLETE (UACMP) WITH MICROSCOPIC  URINE DRUG SCREEN, QUALITATIVE ([facility] ONLY)  CBG MONITORING, ED  TROPONIN I (HIGH SENSITIVITY)   ____________________________________________  EKG   ____________________________________________  RADIOLOGY I personally viewed and evaluated these images as part of my medical decision making, as well as reviewing the written report by the radiologist.  No results found.  ____________________________________________    PROCEDURES  Procedure(s) performed:    Procedures    Medications  sodium chloride 0.9 % bolus 1,000 mL (has no administration in time range)     ____________________________________________   INITIAL IMPRESSION / ASSESSMENT AND PLAN / ED COURSE  Pertinent labs & imaging results that were available during my care of the patient were reviewed by me and considered in my medical decision making (see chart for details).  Review of the [place] CSRS was performed in accordance of the [facility] prior to dispensing any controlled drugs.  Clinical Course as of Oct 26 2321  Mon Oct 27, 2018  2311 Patient presented to the emergency department via EMS after syncopal episode at work.  Patient reports that he was not feeling right  starting yesterday.  Patient does endorse a cough but is unable to further quantify not feeling right."  Patient denies any fevers or chills, nasal congestion, sore throat, abdominal pain, nausea vomiting, diarrhea or constipation.  Triage noted that patient was here for fevers, nasal congestion and cough.  Patient denies any fevers or nasal congestion but does endorse a cough.  Cough is nonproductive in nature.  Patient states that he is here from work via EMS due to the syncopal episode.  Patient has a history of asthma but does not take any routine medication for same.  He denies any cardiac history.  No history of repeat syncope.  Patient states that he has been eating and drinking his normal amounts recently.    [JC]    Clinical Course User Index [JC] Donyae Kohn, Delorise Royals, PA-C        Patient presented to the emergency department via EMS from work after syncopal episode.  Initially, patient was triaged with fevers and chills, nasal congestion and cough.  On my entering the room, patient states that he was here from work via EMS after syncope.  Patient states he had not been feeling well over the past 2 days.  He does  endorse a nonproductive cough but denied any fevers or chills, nasal congestion or sore throat.  No history of syncope.  No recent trauma to the head.  Patient denied any headache, neck pain or stiffness, fevers or chills, chest pain, shortness of breath, domino pain, nausea vomiting, diarrhea or constipation.  At this time, patient will receive labs, chest x-ray, EKG.  Differential includes syncope, vasovagal episode, metabolic abnormality, illicit substance use.  Patient was brought back to this side of the emergency department right before closure.  As such, patient will be transferred to the major side emergency department to complete work-up.  I discussed the patient with attending provider, Dr. Don PerkingVeronese.  Patient's history, physical exam, orders to this point were discussed with  attending provider.  Final diagnosis and disposition will be provided by attending provider.     This chart was dictated using voice recognition software/Dragon. Despite best efforts to proofread, errors can occur which can change the meaning. Any change was purely unintentional.    Racheal PatchesCuthriell, Arinze Rivadeneira D, PA-C 10/27/18 2323    Don PerkingVeronese, [place], MD 10/28/18 [phone]

## 2018-10-27 NOTE — ED Triage Notes (Signed)
Pt presents to ED with fever, non productive cough, and nasal congestion since yesterday. Pt currently has no increased work of breathing or acute distress noted. skin warm and dry.

## 2018-10-27 NOTE — ED Notes (Signed)
Resumed care from [employer] d rn.  Pt brought from [employer].  Iv started and labs sent.  md at bedside.

## 2018-10-28 ENCOUNTER — Emergency Department: Payer: Self-pay

## 2018-10-28 LAB — COMPREHENSIVE METABOLIC PANEL
ALT: 20 U/L (ref 0–44)
AST: 17 U/L (ref 15–41)
Albumin: 4.1 g/dL (ref 3.5–5.0)
Alkaline Phosphatase: 60 U/L (ref 38–126)
Anion gap: 6 (ref 5–15)
BUN: 14 mg/dL (ref 6–20)
CO2: 25 mmol/L (ref 22–32)
Calcium: 9.4 mg/dL (ref 8.9–10.3)
Chloride: 105 mmol/L (ref 98–111)
Creatinine, Ser: 1 mg/dL (ref 0.61–1.24)
GFR calc Af Amer: >60 mL/min (ref >60)
GFR calc non Af Amer: >60 mL/min (ref >60)
Glucose, Bld: 96 mg/dL (ref 70–99)
Potassium: 4 mmol/L (ref 3.5–5.1)
Sodium: 136 mmol/L (ref 135–145)
Total Bilirubin: 1.2 mg/dL (ref 0.3–1.2)
Total Protein: 6.9 g/dL (ref 6.5–8.1)

## 2018-10-28 LAB — URINE DRUG SCREEN, QUALITATIVE (ARMC ONLY)
Amphetamines, Ur Screen: NOT DETECTED
Barbiturates, Ur Screen: NOT DETECTED
Benzodiazepine, Ur Scrn: NOT DETECTED
Cannabinoid 50 Ng, Ur ~~LOC~~: POSITIVE — AB
Cocaine Metabolite,Ur ~~LOC~~: NOT DETECTED
MDMA (Ecstasy)Ur Screen: NOT DETECTED
Methadone Scn, Ur: NOT DETECTED
Opiate, Ur Screen: NOT DETECTED
Phencyclidine (PCP) Ur S: NOT DETECTED
Tricyclic, Ur Screen: NOT DETECTED

## 2018-10-28 LAB — TROPONIN I (HIGH SENSITIVITY): Troponin I (High Sensitivity): 3 ng/L (ref <18)

## 2018-10-28 LAB — SARS CORONAVIRUS 2 (TAT 6-24 HRS): SARS Coronavirus 2: NEGATIVE

## 2018-10-28 MED ORDER — ONDANSETRON 4 MG PO TBDP: 4.0000 mg | ORAL_TABLET | Freq: Three times a day (TID) | ORAL | 0 refills | Status: DC | PRN

## 2018-10-28 NOTE — Discharge Instructions (Signed)

## 2018-10-28 NOTE — ED Notes (Signed)
Patient transported to CT 

## 2018-12-26 ENCOUNTER — Emergency Department: Payer: Medicaid Other

## 2018-12-26 ENCOUNTER — Emergency Department
Admission: EM | Admit: 2018-12-26 | Discharge: 2018-12-26 | Disposition: A | Discharge status: Home / Self Care | Payer: Medicaid Other | Attending: Emergency Medicine | Admitting: Emergency Medicine

## 2018-12-26 ENCOUNTER — Other Ambulatory Visit: Payer: Self-pay

## 2018-12-26 DIAGNOSIS — R109 Unspecified abdominal pain: Secondary | ICD-10-CM

## 2018-12-26 DIAGNOSIS — Z79899 Other long term (current) drug therapy: Secondary | ICD-10-CM | POA: Insufficient documentation

## 2018-12-26 DIAGNOSIS — R1032 Left lower quadrant pain: Secondary | ICD-10-CM | POA: Insufficient documentation

## 2018-12-26 DIAGNOSIS — F17210 Nicotine dependence, cigarettes, uncomplicated: Secondary | ICD-10-CM | POA: Insufficient documentation

## 2018-12-26 DIAGNOSIS — R112 Nausea with vomiting, unspecified: Secondary | ICD-10-CM | POA: Insufficient documentation

## 2018-12-26 DIAGNOSIS — R197 Diarrhea, unspecified: Secondary | ICD-10-CM

## 2018-12-26 DIAGNOSIS — J45909 Unspecified asthma, uncomplicated: Secondary | ICD-10-CM | POA: Insufficient documentation

## 2018-12-26 LAB — CBC
HCT: 39.3 % (ref 39.0–52.0)
Hemoglobin: 13.6 g/dL (ref 13.0–17.0)
MCH: 28 pg (ref 26.0–34.0)
MCHC: 34.6 g/dL (ref 30.0–36.0)
MCV: 80.9 fL (ref 80.0–100.0)
Platelets: 245 10*3/uL (ref 150–400)
RBC: 4.86 MIL/uL (ref 4.22–5.81)
RDW: 13.3 % (ref 11.5–15.5)
WBC: 7.1 10*3/uL (ref 4.0–10.5)
nRBC: 0 % (ref 0.0–0.2)

## 2018-12-26 LAB — COMPREHENSIVE METABOLIC PANEL
ALT: 19 U/L (ref 0–44)
AST: 18 U/L (ref 15–41)
Albumin: 3.8 g/dL (ref 3.5–5.0)
Alkaline Phosphatase: 65 U/L (ref 38–126)
Anion gap: 7 (ref 5–15)
BUN: 17 mg/dL (ref 6–20)
CO2: 24 mmol/L (ref 22–32)
Calcium: 9.1 mg/dL (ref 8.9–10.3)
Chloride: 109 mmol/L (ref 98–111)
Creatinine, Ser: 1.13 mg/dL (ref 0.61–1.24)
GFR calc Af Amer: >60 mL/min (ref >60)
GFR calc non Af Amer: >60 mL/min (ref >60)
Glucose, Bld: 117 mg/dL — ABNORMAL HIGH (ref 70–99)
Potassium: 3.7 mmol/L (ref 3.5–5.1)
Sodium: 140 mmol/L (ref 135–145)
Total Bilirubin: 0.9 mg/dL (ref 0.3–1.2)
Total Protein: 6.5 g/dL (ref 6.5–8.1)

## 2018-12-26 LAB — URINALYSIS, COMPLETE (UACMP) WITH MICROSCOPIC
Bacteria, UA: NONE SEEN
Bilirubin Urine: NEGATIVE
Glucose, UA: NEGATIVE mg/dL
Hgb urine dipstick: NEGATIVE
Ketones, ur: NEGATIVE mg/dL
Leukocytes,Ua: NEGATIVE
Nitrite: NEGATIVE
Protein, ur: NEGATIVE mg/dL
Specific Gravity, Urine: 1.031 — ABNORMAL HIGH (ref 1.005–1.030)
pH: 6 (ref 5.0–8.0)

## 2018-12-26 LAB — LIPASE, BLOOD: Lipase: 21 U/L (ref 11–51)

## 2018-12-26 MED ORDER — DICYCLOMINE HCL 20 MG PO TABS: 20.0000 mg | ORAL_TABLET | Freq: Three times a day (TID) | ORAL | 0 refills | Status: DC | PRN

## 2018-12-26 NOTE — ED Triage Notes (Signed)
Patient c/o lower abdominal pain, diarrhea with liquid stools X 2 days.

## 2018-12-26 NOTE — ED Provider Notes (Signed)
[facility] Emergency Department Provider Note  Time seen: 9:17 PM  I have reviewed the triage vital signs and the nursing notes.   HISTORY  Chief Complaint Abdominal Pain and Diarrhea   HPI Evan Leblanc. is a 28 y.o. male with a past medical history of asthma presents to the emergency department for lower abdominal pain and diarrhea.  According to the patient for the past 3 days he has had left lower quadrant abdominal pain and diarrhea.  Denies any dysuria or hematuria.  Does state one episode of vomiting 2 days ago with intermittent nausea.  No fever cough or shortness of breath.  Describes the pain as mild, somewhat positional dull pain in the left lower quadrant.   Past Medical History:  Diagnosis Date  . Asthma     There are no active problems to display for this patient.   Past Surgical History:  Procedure Laterality Date  . FRACTURE SURGERY     left ankle    Prior to Admission medications   Medication Sig Start Date End Date Taking? Authorizing Provider  ondansetron (ZOFRAN ODT) 4 MG disintegrating tablet Take 1 tablet (4 mg total) by mouth every 8 (eight) hours as needed. 10/28/18   Alfred Levins, [place], MD  tobramycin (TOBREX) 0.3 % ophthalmic solution Place 2 drops into the left eye every 4 (four) hours. While awake 10/03/18   Letitia Neri L, PA-C    No Known Allergies  No family history on file.  Social History Social History   Tobacco Use  . Smoking status: Current Every Day Smoker    Packs/day: 1.00    Types: Cigarettes  . Smokeless tobacco: Never Used  Substance Use Topics  . Alcohol use: No  . Drug use: No    Review of Systems Constitutional: Negative for fever. Cardiovascular: Negative for chest pain. Respiratory: Negative for shortness of breath. Gastrointestinal: Left lower quadrant abdominal pain.  Positive for nausea vomiting.  Positive diarrhea. Genitourinary: Negative for urinary compaints Musculoskeletal:  Negative for musculoskeletal complaints Neurological: Negative for headache All other ROS negative  ____________________________________________   PHYSICAL EXAM:  VITAL SIGNS: ED Triage Vitals  Enc Vitals Group     BP 12/26/18 1858 111/62     Pulse Rate 12/26/18 1858 63     Resp 12/26/18 1858 16     Temp 12/26/18 1858 98.2 F (36.8 C)     Temp Source 12/26/18 1858 Oral     SpO2 12/26/18 1858 99 %     Weight 12/26/18 1902 165 lb (74.8 kg)     Height 12/26/18 1902 6\' 1"  (1.854 m)     Head Circumference --      Peak Flow --      Pain Score 12/26/18 1902 8     Pain Loc --      Pain Edu? --      Excl. in [place]? --    Constitutional: Alert and oriented. Well appearing and in no distress. Eyes: Normal exam ENT      Head: Normocephalic and atraumatic.      Mouth/Throat: Mucous membranes are moist. Cardiovascular: Normal rate, regular rhythm.  Respiratory: Normal respiratory effort without tachypnea nor retractions. Breath sounds are clear  Gastrointestinal: Soft, moderate left lower quadrant tenderness.  No rebound guarding or distention. Musculoskeletal: Nontender with normal range of motion in all extremities.  Neurologic:  Normal speech and language. No gross focal neurologic deficits  Skin:  Skin is warm, dry and intact.  Psychiatric: Mood  and affect are normal.   ____________________________________________   RADIOLOGY  IMPRESSION:  No acute intra-abdominal findings to explain the patient's symptoms.   ____________________________________________   INITIAL IMPRESSION / ASSESSMENT AND PLAN / ED COURSE  Pertinent labs & imaging results that were available during my care of the patient were reviewed by me and considered in my medical decision making (see chart for details).   Patient presents emergency department for left lower quadrant abdominal pain and 3 days of diarrhea.  Differential would include gastroenteritis, enteritis, colitis or diverticulitis,  ureterolithiasis pyelonephritis or urinary tract infection.  Patient's blood work is largely within normal limits including a normal white blood cell count.  Urinalysis is pending.  CT scan pending.  Patient agreeable to plan of care.  Evan Leblanc. was evaluated in Emergency Department on 12/26/2018 for the symptoms described in the history of present illness. He was evaluated in the context of the global COVID-19 pandemic, which necessitated consideration that the patient might be at risk for infection with the SARS-CoV-2 virus that causes COVID-19. Institutional protocols and algorithms that pertain to the evaluation of patients at risk for COVID-19 are in a state of rapid change based on information released by regulatory bodies including the CDC and federal and state organizations. These policies and algorithms were followed during the patient's care in the ED.  ____________________________________________   FINAL CLINICAL IMPRESSION(S) / ED DIAGNOSES  Diarrhea Left lower quadrant pain   Minna Antis, MD 12/26/18 2306

## 2018-12-26 NOTE — ED Notes (Signed)
Family at bedside. 

## 2018-12-26 NOTE — ED Notes (Signed)
Pt in CT.

## 2018-12-26 NOTE — ED Notes (Signed)
Pt HR maintaining 38-40's. PT denies CP/ [facility] or any other symptoms. Pt states he is usually very active.  Will make MD Paduhowski aware.

## 2019-02-03 ENCOUNTER — Ambulatory Visit: Payer: Self-pay

## 2019-02-11 ENCOUNTER — Other Ambulatory Visit: Payer: Self-pay

## 2019-02-11 ENCOUNTER — Ambulatory Visit (LOCAL_COMMUNITY_HEALTH_CENTER): Payer: Self-pay

## 2019-02-11 ENCOUNTER — Ambulatory Visit: Payer: Self-pay

## 2019-02-11 ENCOUNTER — Encounter: Payer: Self-pay | Admitting: Family Medicine

## 2019-02-11 ENCOUNTER — Ambulatory Visit: Payer: Self-pay | Admitting: Family Medicine

## 2019-02-11 DIAGNOSIS — Z720 Tobacco use: Secondary | ICD-10-CM

## 2019-02-11 DIAGNOSIS — Z202 Contact with and (suspected) exposure to infections with a predominantly sexual mode of transmission: Secondary | ICD-10-CM

## 2019-02-11 DIAGNOSIS — Z113 Encounter for screening for infections with a predominantly sexual mode of transmission: Secondary | ICD-10-CM

## 2019-02-11 DIAGNOSIS — Z23 Encounter for immunization: Secondary | ICD-10-CM

## 2019-02-11 MED ORDER — PENICILLIN G BENZATHINE 2400000 UNIT/4ML IM SUSP
2.4000 10*6.[IU] | Freq: Once | INTRAMUSCULAR | Status: AC
  Administered 2019-02-11: 2400000 [IU] via INTRAMUSCULAR

## 2019-02-11 NOTE — Progress Notes (Signed)
Here for STD testing. Patient's girlfriend here and was told she is positive for syphilis at [facility].Burt Knack, RN

## 2019-02-11 NOTE — Progress Notes (Signed)
[facility] Department STI clinic/screening visit  Subjective:  Evan Leblanc. is a 29 y.o. male being seen today for  Chief Complaint  Patient presents with  . Exposure to STD     The patient reports they do not have symptoms.   Patient has the following medical conditions:  There are no problems to display for this patient.   HPI  Pt reports he is here as a contact to syphilis. Denies symptoms.   See flowsheet for further details and programmatic requirements.    No components found for: HCV  The following portions of the patient's history were reviewed and updated as appropriate: allergies, current medications, past medical history, past social history, past surgical history and problem list.  Objective:  There were no vitals filed for this visit.   Physical Exam Constitutional:      Appearance: Normal appearance.  HENT:     Head: Normocephalic and atraumatic.     Comments: No nits or hair loss    Mouth/Throat:     Mouth: Mucous membranes are moist.     Pharynx: Oropharynx is clear. No oropharyngeal exudate or posterior oropharyngeal erythema.  Pulmonary:     Effort: Pulmonary effort is normal.  Abdominal:     General: Abdomen is flat.     Palpations: Abdomen is soft. There is no hepatomegaly or mass.     Tenderness: There is no abdominal tenderness.  Genitourinary:    Pubic Area: No rash or pubic lice.      Penis: Normal and circumcised.      Testes: Normal.     Epididymis:     Right: Normal.     Left: Normal.     Rectum: Normal.  Lymphadenopathy:     Head:     Right side of head: No preauricular or posterior auricular adenopathy.     Left side of head: No preauricular or posterior auricular adenopathy.     Cervical: No cervical adenopathy.     Upper Body:     Right upper body: No supraclavicular or axillary adenopathy.     Left upper body: No supraclavicular or axillary adenopathy.     Lower Body: No right inguinal adenopathy. No left  inguinal adenopathy.  Skin:    General: Skin is warm and dry.     Findings: No rash.  Neurological:     Mental Status: He is alert and oriented to person, place, and time.       Assessment and Plan:  Evan Leblanc. is a 29 y.o. male presenting to the [facility] Department for STI screening   1. Screening examination for venereal disease -Screenings today as below. Urine GC/Chlamydia instead of gram stain d/t staff issues. -Patient does meet criteria for HepB, HepC Screening. Accepts these screenings. -Counseled on warning s/sx and when to seek care. Recommended condom use with all sex and discussed importance of condom use for STI prevention. - GC/Chlamydia Probe Amp([facility]) - HBV Antigen/Antibody [facility] - HIV/HCV [place] [facility] - Syphilis Serology, [facility]  2. Syphilis contact -Will treat as contact to syphilis w/Penicillin G 2.4 million IM x1 . -If positive RPR will need titers at 6 and 12 months.  -Pt advised no sex x2 wks. Encouraged condoms with all sex. - Syphilis Serology, [place] [facility] - Penicillin G Benzathine SUSP 2,400,000 Units  3. Tobacco use Encouraged pt in their desire to quit, counseled using 5 A's. Tobacco Quitline info given and offered referral  for behavioral health which pt declines.  PCP list given per pt request.    No follow-ups on file.  No future appointments.  Ann Held, PA-C

## 2019-02-11 NOTE — Progress Notes (Signed)
Provided [facility] Services information sheet and [place] QuitLine card. Tolerated Bicillin injections with minimal complaint. Client waited in exam room x15 minutes following injections and no adverse symptoms experienced. Jossie Ng, RN

## 2019-02-12 ENCOUNTER — Encounter: Payer: Self-pay | Admitting: Family Medicine

## 2019-02-12 LAB — GC/CHLAMYDIA PROBE AMP
Chlamydia trachomatis, NAA: NEGATIVE
Neisseria Gonorrhoeae by PCR: NEGATIVE

## 2019-02-17 LAB — HEPATITIS B SURFACE ANTIGEN

## 2019-02-20 LAB — HM HEPATITIS C SCREENING LAB: HM Hepatitis Screen: NEGATIVE

## 2019-02-20 LAB — HM HIV SCREENING LAB: HM HIV Screening: NEGATIVE

## 2019-08-14 ENCOUNTER — Emergency Department: Payer: Medicaid Other

## 2019-08-14 ENCOUNTER — Emergency Department
Admission: EM | Admit: 2019-08-14 | Discharge: 2019-08-14 | Disposition: A | Discharge status: Home / Self Care | Payer: Medicaid Other | Attending: Emergency Medicine | Admitting: Emergency Medicine

## 2019-08-14 ENCOUNTER — Other Ambulatory Visit: Payer: Self-pay

## 2019-08-14 DIAGNOSIS — T1490XA Injury, unspecified, initial encounter: Secondary | ICD-10-CM

## 2019-08-14 DIAGNOSIS — Y9289 Other specified places as the place of occurrence of the external cause: Secondary | ICD-10-CM | POA: Insufficient documentation

## 2019-08-14 DIAGNOSIS — S60031A Contusion of right middle finger without damage to nail, initial encounter: Secondary | ICD-10-CM | POA: Insufficient documentation

## 2019-08-14 DIAGNOSIS — F17210 Nicotine dependence, cigarettes, uncomplicated: Secondary | ICD-10-CM | POA: Insufficient documentation

## 2019-08-14 DIAGNOSIS — Y939 Activity, unspecified: Secondary | ICD-10-CM | POA: Insufficient documentation

## 2019-08-14 DIAGNOSIS — Y999 Unspecified external cause status: Secondary | ICD-10-CM | POA: Insufficient documentation

## 2019-08-14 DIAGNOSIS — J45909 Unspecified asthma, uncomplicated: Secondary | ICD-10-CM | POA: Insufficient documentation

## 2019-08-14 MED ORDER — TRAMADOL HCL 50 MG PO TABS
50.0000 mg | ORAL_TABLET | Freq: Once | ORAL | Status: AC
  Administered 2019-08-14: 50 mg via ORAL
  Filled 2019-08-14: qty 1

## 2019-08-14 MED ORDER — IBUPROFEN 600 MG PO TABS: 600.0000 mg | ORAL_TABLET | Freq: Three times a day (TID) | ORAL | 0 refills | Status: DC | PRN

## 2019-08-14 MED ORDER — IBUPROFEN 600 MG PO TABS
600.0000 mg | ORAL_TABLET | Freq: Once | ORAL | Status: AC
  Administered 2019-08-14: 600 mg via ORAL
  Filled 2019-08-14: qty 1

## 2019-08-14 MED ORDER — TRAMADOL HCL 50 MG PO TABS: 50.0000 mg | ORAL_TABLET | Freq: Four times a day (QID) | ORAL | 0 refills | Status: DC | PRN

## 2019-08-14 NOTE — ED Notes (Signed)
See triage note  States he bent his right middle finger back  Pain and swelling noted

## 2019-08-14 NOTE — ED Provider Notes (Signed)
[facility] Emergency Department Provider Note   ____________________________________________   First MD Initiated Contact with Patient 08/14/19 1333     (approximate)  I have reviewed the triage vital signs and the nursing notes.   HISTORY  Chief Complaint Hand Injury   as a 10 HPI Evan Guglielmo. is a 29 y.o. male patient presents with right middle finger pain and edema secondary to contusion yesterday at work.  Patient denies loss of sensation.  Patient decreased range of motion with flexion limited by complaint of pain.  Patient rates pain as a 10/10.  Patient described pain is "achy".  No palliative measure for complaint.  Patient is right-hand dominant.         Past Medical History:  Diagnosis Date  . Asthma     There are no problems to display for this patient.   Past Surgical History:  Procedure Laterality Date  . FRACTURE SURGERY     left ankle    Prior to Admission medications   Medication Sig Start Date End Date Taking? Authorizing Provider  ibuprofen (ADVIL) 600 MG tablet Take 1 tablet (600 mg total) by mouth every 8 (eight) hours as needed. 08/14/19   Joni Reining, PA-C  traMADol (ULTRAM) 50 MG tablet Take 1 tablet (50 mg total) by mouth every 6 (six) hours as needed for moderate pain. 08/14/19   Joni Reining, PA-C    Allergies Patient has no known allergies.  History reviewed. No pertinent family history.  Social History Social History   Tobacco Use  . Smoking status: Current Every Day Smoker    Packs/day: 0.50    Years: 10.00    Pack years: 5.00    Types: Cigarettes  . Smokeless tobacco: Never Used  Vaping Use  . Vaping Use: Never used  Substance Use Topics  . Alcohol use: No    Comment: 1-2x/mo  . Drug use: Yes    Frequency: 14.0 times per week    Types: Marijuana    Review of Systems Constitutional: No fever/chills Eyes: No visual changes. ENT: No sore throat. Cardiovascular: Denies chest  pain. Respiratory: Denies shortness of breath. Gastrointestinal: No abdominal pain.  No nausea, no vomiting.  No diarrhea.  No constipation. Genitourinary: Negative for dysuria. Musculoskeletal: Right middle finger pain and edema.. Skin: Negative for rash. Neurological: Negative for headaches, focal weakness or numbness.   ____________________________________________   PHYSICAL EXAM:  VITAL SIGNS: ED Triage Vitals [08/14/19 1250]  Enc Vitals Group     BP (!) 117/59     Pulse Rate 61     Resp 16     Temp 98.3 F (36.8 C)     Temp Source Oral     SpO2 100 %     Weight 165 lb (74.8 kg)     Height 6\' 1"  (1.854 m)     Head Circumference      Peak Flow      Pain Score 10     Pain Loc      Pain Edu?      Excl. in [place]?    Constitutional: Alert and oriented. Well appearing and in no acute distress. Cardiovascular: Normal rate, regular rhythm. Grossly normal heart sounds.  Good peripheral circulation. Respiratory: Normal respiratory effort.  No retractions. Lungs CTAB. Musculoskeletal: No obvious deformity to the right middle finger.  Patient has moderate edema at the third metacarpal head. Neurologic:  Normal speech and language. No gross focal neurologic deficits are appreciated.  No gait instability. Skin:  Skin is warm, dry and intact. No rash noted. Psychiatric: Mood and affect are normal. Speech and behavior are normal.  ____________________________________________   LABS (all labs ordered are listed, but only abnormal results are displayed)  Labs Reviewed - No data to display ____________________________________________  EKG   ____________________________________________  RADIOLOGY  ED MD interpretation:    Official radiology report(s): DG Finger Middle Right  Result Date: 08/14/2019 CLINICAL DATA:  Right long finger pain and swelling after injury 1 day ago EXAM: RIGHT MIDDLE FINGER 2+V COMPARISON:  04/21/2018 FINDINGS: There is no evidence of fracture or  dislocation. There is no evidence of arthropathy or other focal bone abnormality. Soft tissues are unremarkable. IMPRESSION: Negative. Electronically Signed   By: Duanne Guess D.O.   On: 08/14/2019 13:38    ____________________________________________   PROCEDURES  Procedure(s) performed (including Critical Care):  Procedures   ____________________________________________   INITIAL IMPRESSION / ASSESSMENT AND PLAN / ED COURSE  As part of my medical decision making, I reviewed the following data within the electronic MEDICAL RECORD NUMBER     Patient presents with pain to the third right middle finger secondary to contusion.  Discussed negative x-ray findings with patient.  Patient given discharge care instruction and placed in a finger splint.  Advised take medication as directed.  Follow-up with orthopedic if there is no improvement and 2 to 3 days.    Evan Emery. was evaluated in Emergency Department on 08/14/2019 for the symptoms described in the history of present illness. He was evaluated in the context of the global COVID-19 pandemic, which necessitated consideration that the patient might be at risk for infection with the SARS-CoV-2 virus that causes COVID-19. Institutional protocols and algorithms that pertain to the evaluation of patients at risk for COVID-19 are in a state of rapid change based on information released by regulatory bodies including the CDC and federal and state organizations. These policies and algorithms were followed during the patient's care in the ED.       ____________________________________________   FINAL CLINICAL IMPRESSION(S) / ED DIAGNOSES  Final diagnoses:  Contusion of right middle finger without damage to nail, initial encounter     ED Discharge Orders         Ordered    traMADol (ULTRAM) 50 MG tablet  Every 6 hours PRN     Discontinue  Reprint     08/14/19 1405    ibuprofen (ADVIL) 600 MG tablet  Every 8 hours PRN      Discontinue  Reprint     08/14/19 1405           Note:  This document was prepared using Dragon voice recognition software and may include unintentional dictation errors.    Joni Reining, PA-C 08/14/19 1410    Sharman Cheek, MD 08/14/19 [phone]

## 2019-08-14 NOTE — Discharge Instructions (Signed)
Wear splint for 3 to 5 days as needed.  Take medication as directed.

## 2019-08-14 NOTE — ED Triage Notes (Signed)
Pt was taking trash out yesterday and hit R hand on dumpster. Pt c/o middle finger pain and knuckle pain. Swelling noted. A&O, ambulatory.

## 2019-08-17 ENCOUNTER — Encounter: Payer: Self-pay | Admitting: Emergency Medicine

## 2019-08-17 ENCOUNTER — Emergency Department
Admission: EM | Admit: 2019-08-17 | Discharge: 2019-08-17 | Disposition: A | Discharge status: Home / Self Care | Payer: Medicaid Other | Attending: Emergency Medicine | Admitting: Emergency Medicine

## 2019-08-17 DIAGNOSIS — S60221D Contusion of right hand, subsequent encounter: Secondary | ICD-10-CM

## 2019-08-17 DIAGNOSIS — J45909 Unspecified asthma, uncomplicated: Secondary | ICD-10-CM | POA: Insufficient documentation

## 2019-08-17 DIAGNOSIS — F17210 Nicotine dependence, cigarettes, uncomplicated: Secondary | ICD-10-CM | POA: Insufficient documentation

## 2019-08-17 DIAGNOSIS — Y929 Unspecified place or not applicable: Secondary | ICD-10-CM | POA: Insufficient documentation

## 2019-08-17 DIAGNOSIS — X58XXXA Exposure to other specified factors, initial encounter: Secondary | ICD-10-CM | POA: Insufficient documentation

## 2019-08-17 DIAGNOSIS — S60221A Contusion of right hand, initial encounter: Secondary | ICD-10-CM | POA: Insufficient documentation

## 2019-08-17 DIAGNOSIS — Y939 Activity, unspecified: Secondary | ICD-10-CM | POA: Insufficient documentation

## 2019-08-17 DIAGNOSIS — Y999 Unspecified external cause status: Secondary | ICD-10-CM | POA: Insufficient documentation

## 2019-08-17 NOTE — ED Notes (Signed)
Pt states coming in to get hand wrapped and a work note

## 2019-08-17 NOTE — ED Triage Notes (Signed)
Pt to ED tonight by job to have hand wrapped up and to obtain return to work note. Pt seen x2 days ago. Ace wrap applied to right hand in triage.

## 2019-08-17 NOTE — ED Provider Notes (Signed)
[facility] Emergency Department Provider Note  ____________________________________________  Time seen: Approximately 8:13 PM  I have reviewed the triage vital signs and the nursing notes.   HISTORY  Chief Complaint Hand Pain    HPI Evan Leblanc. is a 29 y.o. male that presents to emergency department for a work note and to have right hand rewrapped.  Patient was evaluated in this emergency department 3 days ago after a hand injury at work.  He states that his hand was bandaged at that time.  He had remove the bandage today for work and would like it rewrapped.  He needs another note to go back to work.  No new injuries.  No pain.  Past Medical History:  Diagnosis Date  . Asthma     There are no problems to display for this patient.   Past Surgical History:  Procedure Laterality Date  . FRACTURE SURGERY     left ankle    Prior to Admission medications   Medication Sig Start Date End Date Taking? Authorizing Provider  ibuprofen (ADVIL) 600 MG tablet Take 1 tablet (600 mg total) by mouth every 8 (eight) hours as needed. 08/14/19   Joni Reining, PA-C  traMADol (ULTRAM) 50 MG tablet Take 1 tablet (50 mg total) by mouth every 6 (six) hours as needed for moderate pain. 08/14/19   Joni Reining, PA-C    Allergies Patient has no known allergies.  History reviewed. No pertinent family history.  Social History Social History   Tobacco Use  . Smoking status: Current Every Day Smoker    Packs/day: 0.50    Years: 10.00    Pack years: 5.00    Types: Cigarettes  . Smokeless tobacco: Never Used  Vaping Use  . Vaping Use: Never used  Substance Use Topics  . Alcohol use: No    Comment: 1-2x/mo  . Drug use: Yes    Frequency: 14.0 times per week    Types: Marijuana     Review of Systems  Respiratory: No SOB. Gastrointestinal: No abdominal pain.  No nausea, no vomiting.  Musculoskeletal: Negative for musculoskeletal pain. Skin: Negative  for rash, abrasions, lacerations, ecchymosis. Neurological: Negative for headaches, numbness or tingling   ____________________________________________   PHYSICAL EXAM:  VITAL SIGNS: ED Triage Vitals  Enc Vitals Group     BP 08/17/19 1918 105/81     Pulse Rate 08/17/19 1918 62     Resp 08/17/19 1918 17     Temp 08/17/19 1918 99.1 F (37.3 C)     Temp Source 08/17/19 1918 Oral     SpO2 08/17/19 1918 99 %     Weight --      Height --      Head Circumference --      Peak Flow --      Pain Score 08/17/19 2012 0     Pain Loc --      Pain Edu? --      Excl. in GC? --      Constitutional: Alert and oriented. Well appearing and in no acute distress. Eyes: Conjunctivae are normal. PERRL. EOMI. Head: Atraumatic. ENT:      Ears:      Nose: No congestion/rhinnorhea.      Mouth/Throat: Mucous membranes are moist.  Neck: No stridor.   Cardiovascular: Normal rate, regular rhythm.  Good peripheral circulation. Respiratory: Normal respiratory effort without tachypnea or retractions. Lungs CTAB. Good air entry to the bases with no decreased or  absent breath sounds. Musculoskeletal: Full range of motion to all extremities. No gross deformities appreciated.  Minimal swelling to right middle knuckle.  Full range of motion of fingers without pain. Neurologic:  Normal speech and language. No gross focal neurologic deficits are appreciated.  Skin:  Skin is warm, dry and intact. No rash noted. Psychiatric: Mood and affect are normal. Speech and behavior are normal. Patient exhibits appropriate insight and judgement.   ____________________________________________   LABS (all labs ordered are listed, but only abnormal results are displayed)  Labs Reviewed - No data to display ____________________________________________  EKG   ____________________________________________  RADIOLOGY   No results found.  ____________________________________________    PROCEDURES  Procedure(s)  performed:    Procedures    Medications - No data to display   ____________________________________________   INITIAL IMPRESSION / ASSESSMENT AND PLAN / ED COURSE  Pertinent labs & imaging results that were available during my care of the patient were reviewed by me and considered in my medical decision making (see chart for details).  Review of the  [place]RS was performed in accordance of the [facility] prior to dispensing any controlled drugs.    Patient presented to emergency department for a work-up and rewrap of his hand.  Vital signs and exam are reassuring.  Patient is to follow up with primary care as directed. Patient is given ED precautions to return to the ED for any worsening or new symptoms.   Evan Leblanc. was evaluated in Emergency Department on 08/17/2019 for the symptoms described in the history of present illness. He was evaluated in the context of the global COVID-19 pandemic, which necessitated consideration that the patient might be at risk for infection with the SARS-CoV-2 virus that causes COVID-19. Institutional protocols and algorithms that pertain to the evaluation of patients at risk for COVID-19 are in a state of rapid change based on information released by regulatory bodies including the CDC and federal and state organizations. These policies and algorithms were followed during the patient's care in the ED.  ____________________________________________  FINAL CLINICAL IMPRESSION(S) / ED DIAGNOSES  Final diagnoses:  Contusion of right hand, subsequent encounter      NEW MEDICATIONS STARTED DURING THIS VISIT:  ED Discharge Orders    None          This chart was dictated using voice recognition software/Dragon. Despite best efforts to proofread, errors can occur which can change the meaning. Any change was purely unintentional.    Enid Derry, PA-C 08/17/19 2225    Phineas Semen, MD 08/17/19 2246

## 2019-08-17 NOTE — ED Notes (Signed)
Provider aware of heart rate of 50bpm and is okay with pt being discharged

## 2019-10-04 IMAGING — DX RIGHT HAND - 2 VIEW
2 series · 2 of 2 positions shown · non-contrast
Comparison: Radiographs August 26, 2014.

CLINICAL DATA: Right hand pain after lifting injury 2 days ago.

EXAM:
RIGHT HAND - 2 VIEW

[hand ap]
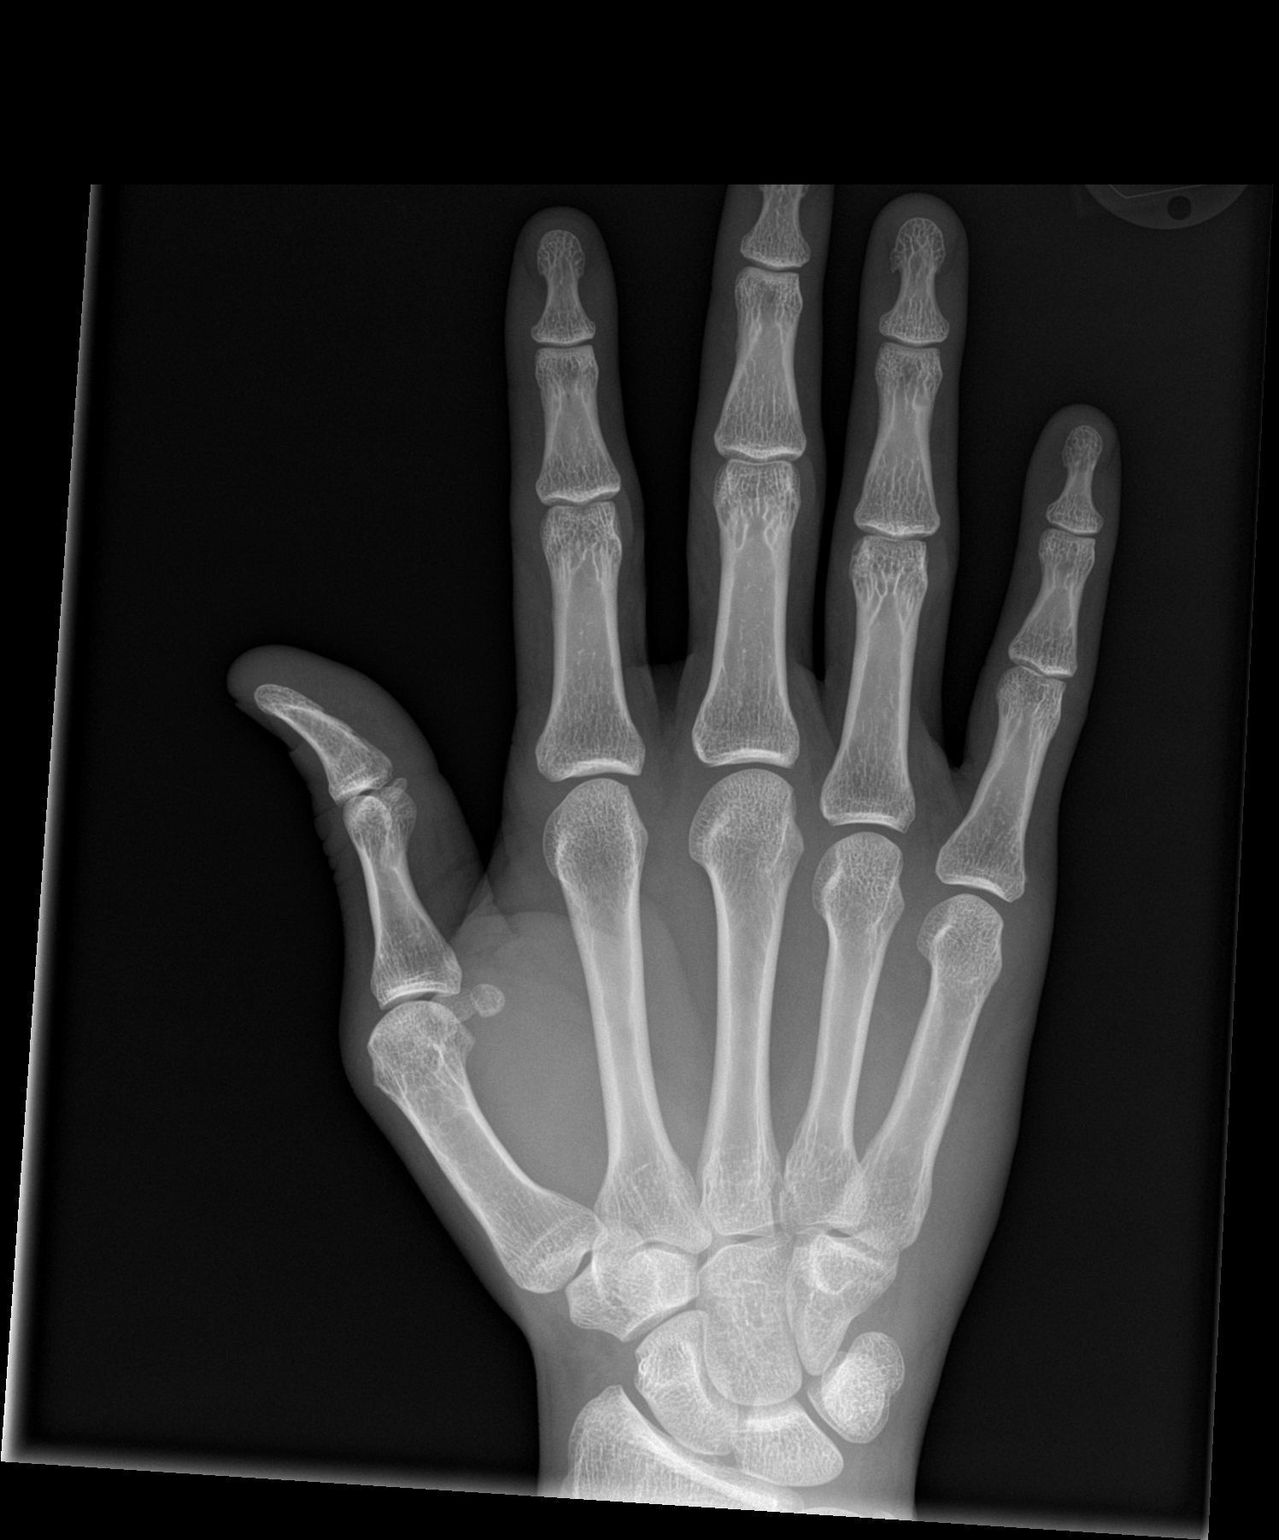

[hand lat]
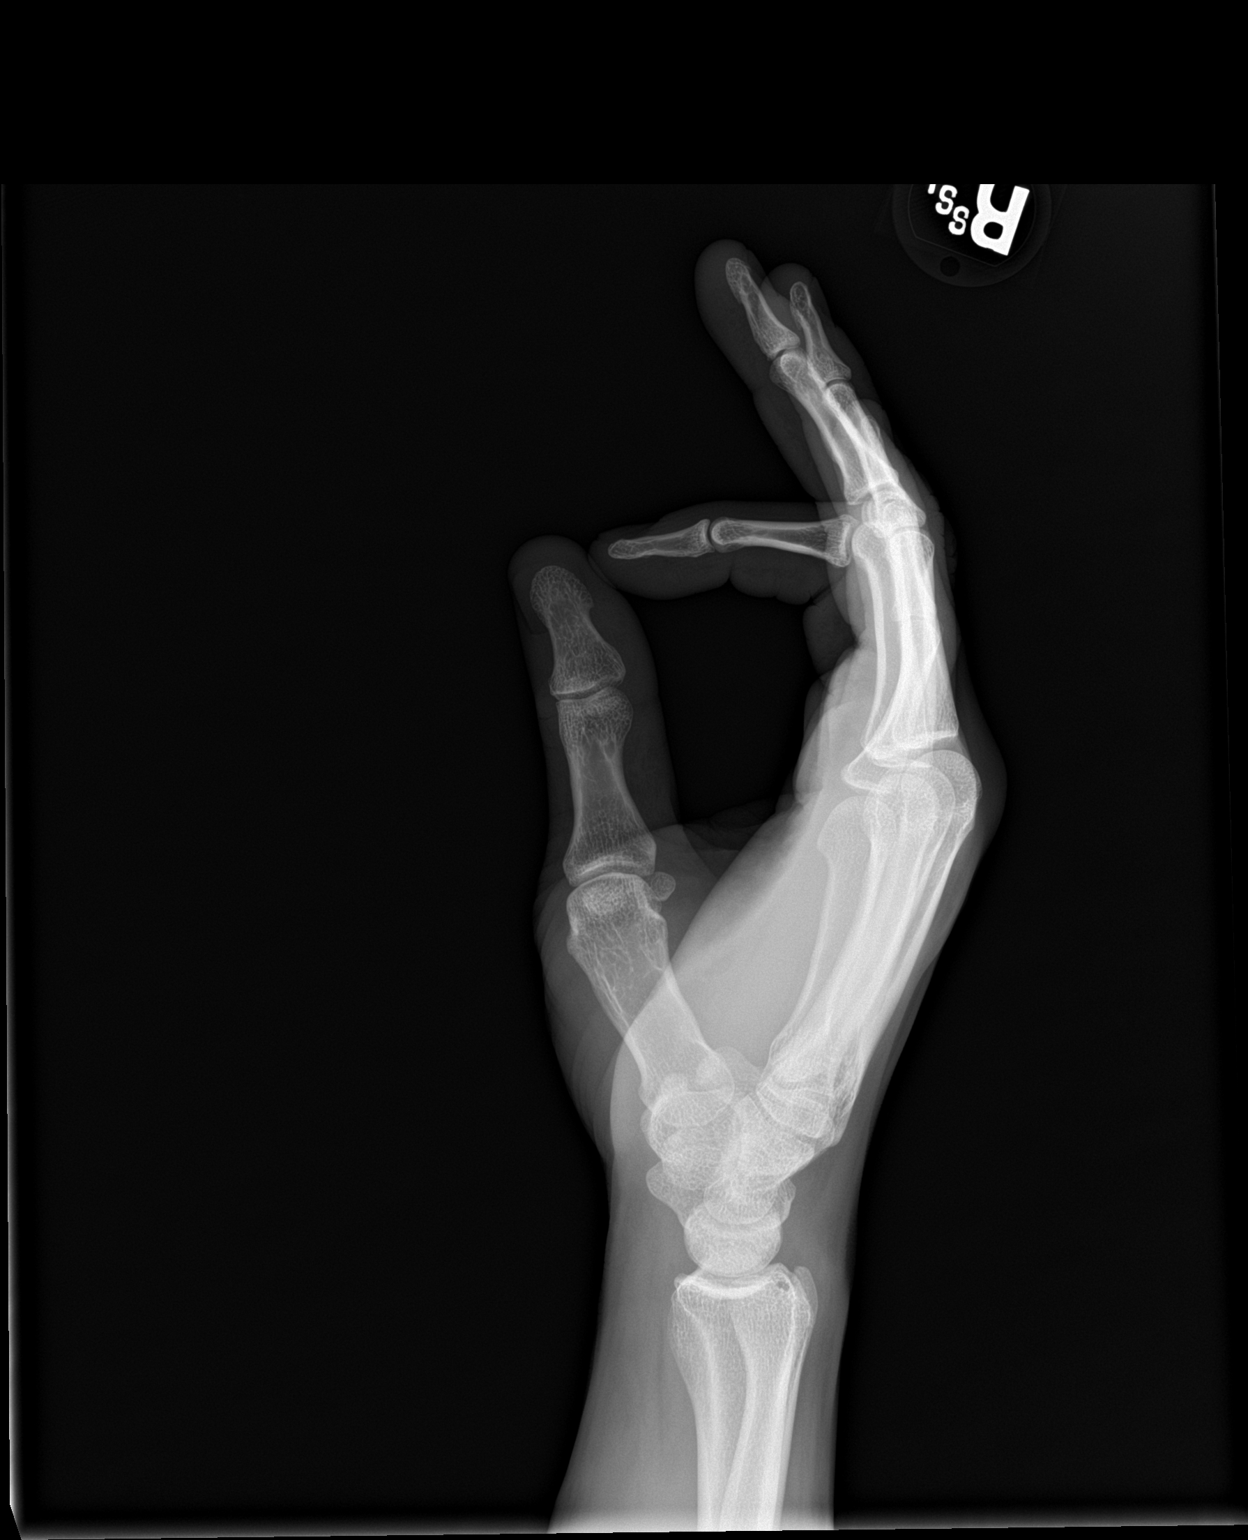

[2 of 2 positions shown; findings below may reference images not displayed]

FINDINGS: There is no evidence of fracture or dislocation. There is no
evidence of arthropathy or other focal bone abnormality. Soft
tissues are unremarkable.
IMPRESSION: Negative.

## 2020-03-28 ENCOUNTER — Emergency Department
Admission: EM | Admit: 2020-03-28 | Discharge: 2020-03-28 | Disposition: A | Discharge status: Home / Self Care | Payer: Medicaid Other | Attending: Emergency Medicine | Admitting: Emergency Medicine

## 2020-03-28 ENCOUNTER — Encounter: Payer: Self-pay | Admitting: Emergency Medicine

## 2020-03-28 ENCOUNTER — Other Ambulatory Visit: Payer: Self-pay

## 2020-03-28 DIAGNOSIS — Z20822 Contact with and (suspected) exposure to covid-19: Secondary | ICD-10-CM | POA: Insufficient documentation

## 2020-03-28 DIAGNOSIS — R6883 Chills (without fever): Secondary | ICD-10-CM

## 2020-03-28 DIAGNOSIS — F17210 Nicotine dependence, cigarettes, uncomplicated: Secondary | ICD-10-CM | POA: Insufficient documentation

## 2020-03-28 DIAGNOSIS — R059 Cough, unspecified: Secondary | ICD-10-CM | POA: Insufficient documentation

## 2020-03-28 DIAGNOSIS — J45909 Unspecified asthma, uncomplicated: Secondary | ICD-10-CM | POA: Insufficient documentation

## 2020-03-28 LAB — BASIC METABOLIC PANEL
Anion gap: 9 (ref 5–15)
BUN: 10 mg/dL (ref 6–20)
CO2: 24 mmol/L (ref 22–32)
Calcium: 9.5 mg/dL (ref 8.9–10.3)
Chloride: 106 mmol/L (ref 98–111)
Creatinine, Ser: 1.03 mg/dL (ref 0.61–1.24)
GFR, Estimated: >60 mL/min (ref >60)
Glucose, Bld: 97 mg/dL (ref 70–99)
Potassium: 4 mmol/L (ref 3.5–5.1)
Sodium: 139 mmol/L (ref 135–145)

## 2020-03-28 LAB — URINALYSIS, COMPLETE (UACMP) WITH MICROSCOPIC
Bacteria, UA: NONE SEEN
Bilirubin Urine: NEGATIVE
Glucose, UA: NEGATIVE mg/dL
Hgb urine dipstick: NEGATIVE
Ketones, ur: NEGATIVE mg/dL
Leukocytes,Ua: NEGATIVE
Nitrite: NEGATIVE
Protein, ur: NEGATIVE mg/dL
Specific Gravity, Urine: 1.004 — ABNORMAL LOW (ref 1.005–1.030)
Squamous Epithelial / HPF: NONE SEEN (ref 0–5)
WBC, UA: NONE SEEN WBC/hpf (ref 0–5)
pH: 7 (ref 5.0–8.0)

## 2020-03-28 LAB — CBC WITH DIFFERENTIAL/PLATELET
Abs Immature Granulocytes: 0 10*3/uL (ref 0.00–0.07)
Basophils Absolute: 0.1 10*3/uL (ref 0.0–0.1)
Basophils Relative: 2 %
Eosinophils Absolute: 0.3 10*3/uL (ref 0.0–0.5)
Eosinophils Relative: 9 %
HCT: 39.6 % (ref 39.0–52.0)
Hemoglobin: 13 g/dL (ref 13.0–17.0)
Immature Granulocytes: 0 %
Lymphocytes Relative: 42 %
Lymphs Abs: 1.7 10*3/uL (ref 0.7–4.0)
MCH: 27.5 pg (ref 26.0–34.0)
MCHC: 32.8 g/dL (ref 30.0–36.0)
MCV: 83.9 fL (ref 80.0–100.0)
Monocytes Absolute: 0.7 10*3/uL (ref 0.1–1.0)
Monocytes Relative: 17 %
Neutro Abs: 1.2 10*3/uL — ABNORMAL LOW (ref 1.7–7.7)
Neutrophils Relative %: 30 %
Platelets: 233 10*3/uL (ref 150–400)
RBC: 4.72 MIL/uL (ref 4.22–5.81)
RDW: 13.8 % (ref 11.5–15.5)
WBC: 4 10*3/uL (ref 4.0–10.5)
nRBC: 0 % (ref 0.0–0.2)

## 2020-03-28 LAB — SARS CORONAVIRUS 2 (TAT 6-24 HRS): SARS Coronavirus 2: NEGATIVE

## 2020-03-28 NOTE — Discharge Instructions (Signed)
Advised self quarantine pending results of COVID-19 test.  If test is positive quarantine additional 10 days.  If test is negative recommend getting the booster shot.

## 2020-03-28 NOTE — ED Triage Notes (Signed)
Pt to ER states "my insides feel hot and my outsides feel cold and then it's the opposite".  States nausea no vomiting.  States "I did not want to go back to work until I knew I did not have anything".  PT denies fever or other s/s at this time.

## 2020-03-28 NOTE — ED Provider Notes (Signed)
[facility] Emergency Department Provider Note   ____________________________________________   Event Date/Time   First MD Initiated Contact with Patient 03/28/20 1146     (approximate)  I have reviewed the triage vital signs and the nursing notes.   HISTORY  Chief Complaint Chills    HPI Evan Fix. is a 30 y.o. male patient complaint of 2 days of feeling fever and chills.  Denies nausea or vomiting.  States similar episode last month.  Patient has taken both COVID-19 vaccines approximately 9 months ago.  Patient is not taking booster.  Patient denies recent travel or known contact with COVID-19.         Past Medical History:  Diagnosis Date  . Asthma     There are no problems to display for this patient.   Past Surgical History:  Procedure Laterality Date  . FRACTURE SURGERY     left ankle    Prior to Admission medications   Medication Sig Start Date End Date Taking? Authorizing Provider  ibuprofen (ADVIL) 600 MG tablet Take 1 tablet (600 mg total) by mouth every 8 (eight) hours as needed. 08/14/19   Joni Reining, PA-C  traMADol (ULTRAM) 50 MG tablet Take 1 tablet (50 mg total) by mouth every 6 (six) hours as needed for moderate pain. 08/14/19   Joni Reining, PA-C    Allergies Patient has no known allergies.  History reviewed. No pertinent family history.  Social History Social History   Tobacco Use  . Smoking status: Current Every Day Smoker    Packs/day: 0.50    Years: 10.00    Pack years: 5.00    Types: Cigarettes  . Smokeless tobacco: Never Used  Vaping Use  . Vaping Use: Never used  Substance Use Topics  . Alcohol use: No    Comment: 1-2x/mo  . Drug use: Yes    Frequency: 14.0 times per week    Types: Marijuana    Review of Systems Constitutional: Fever/chills.  Eyes: No visual changes. ENT: No sore throat. Cardiovascular: Denies chest pain. Respiratory: Denies shortness of  breath. Gastrointestinal: No abdominal pain.  No nausea, no vomiting.  No diarrhea.  No constipation. Genitourinary: Negative for dysuria. Musculoskeletal: Negative for back pain. Skin: Negative for rash. Neurological: Negative for headaches, focal weakness or numbness.   ____________________________________________   PHYSICAL EXAM:  VITAL SIGNS: ED Triage Vitals [03/28/20 1140]  Enc Vitals Group     BP 122/81     Pulse Rate (!) 50     Resp 18     Temp 98.5 F (36.9 C)     Temp Source Oral     SpO2 100 %     Weight 165 lb (74.8 kg)     Height 6\' 1"  (1.854 m)     Head Circumference      Peak Flow      Pain Score 0     Pain Loc      Pain Edu?      Excl. in GC?     Constitutional: Alert and oriented. Well appearing and in no acute distress. Eyes: Conjunctivae are normal. PERRL. EOMI. Head: Atraumatic. Nose: No congestion/rhinnorhea. Mouth/Throat: Mucous membranes are moist.  Oropharynx non-erythematous. Neck: No stridor. Hematological/Lymphatic/Immunilogical: No cervical lymphadenopathy. Cardiovascular: Asymptomatic bradycardia, regular rhythm. Grossly normal heart sounds.  Good peripheral circulation. Respiratory: Normal respiratory effort.  No retractions. Lungs CTAB. Gastrointestinal: Soft and nontender. No distention. No abdominal bruits. No CVA tenderness. Genitourinary:  Musculoskeletal: No  lower extremity tenderness nor edema.  No joint effusions. Neurologic:  Normal speech and language. No gross focal neurologic deficits are appreciated. No gait instability. Skin:  Skin is warm, dry and intact. No rash noted. Psychiatric: Mood and affect are normal. Speech and behavior are normal.  ____________________________________________   LABS (all labs ordered are listed, but only abnormal results are displayed)  Labs Reviewed  CBC WITH DIFFERENTIAL/PLATELET - Abnormal; Notable for the following components:      Result Value   Neutro Abs 1.2 (*)    All other  components within normal limits  URINALYSIS, COMPLETE (UACMP) WITH MICROSCOPIC - Abnormal; Notable for the following components:   Color, Urine STRAW (*)    APPearance CLEAR (*)    Specific Gravity, Urine 1.004 (*)    All other components within normal limits  SARS CORONAVIRUS 2 (TAT 6-24 HRS)  BASIC METABOLIC PANEL   ____________________________________________  EKG   ____________________________________________  RADIOLOGY I, Joni Reining, personally viewed and evaluated these images (plain radiographs) as part of my medical decision making, as well as reviewing the written report by the radiologist.  ED MD interpretation:    Official radiology report(s): No results found.  ____________________________________________   PROCEDURES  Procedure(s) performed (including Critical Care):  Procedures   ____________________________________________   INITIAL IMPRESSION / ASSESSMENT AND PLAN / ED COURSE  As part of my medical decision making, I reviewed the following data within the electronic MEDICAL RECORD NUMBER         Patient presents with 2 days of fever and chills.  Discussed no acute findings on lab results.  Advised COVID-19 test results are pending.  Advised self quarantine pending results of COVID-19 test.  Test results can be found in the MyChart app.  If test is positive must quarantine per CDC recommendations.      ____________________________________________   FINAL CLINICAL IMPRESSION(S) / ED DIAGNOSES  Final diagnoses:  Chills     ED Discharge Orders    None      *Please note:  Evan Emery. was evaluated in Emergency Department on 03/28/2020 for the symptoms described in the history of present illness. He was evaluated in the context of the global COVID-19 pandemic, which necessitated consideration that the patient might be at risk for infection with the SARS-CoV-2 virus that causes COVID-19. Institutional protocols and algorithms that  pertain to the evaluation of patients at risk for COVID-19 are in a state of rapid change based on information released by regulatory bodies including the CDC and federal and state organizations. These policies and algorithms were followed during the patient's care in the ED.  Some ED evaluations and interventions may be delayed as a result of limited staffing during and the pandemic.*   Note:  This document was prepared using Dragon voice recognition software and may include unintentional dictation errors.    Joni Reining, PA-C 03/28/20 1301    Delton Prairie, MD 03/28/20 [phone]

## 2020-06-09 IMAGING — CT CT RENAL STONE PROTOCOL
3 of 4 series · 8 of 46 positions shown, 15 images · non-contrast
Comparison: CT abdomen pelvis 11/09/2013

CLINICAL DATA: Flank pain, stone disease suspected. Lower abdominal
pain, diarrhea for 2 days no history of abdominal surgery or
urolithiasis.

EXAM:
CT ABDOMEN AND PELVIS WITHOUT CONTRAST
TECHNIQUE: Multidetector CT imaging of the abdomen and pelvis was performed
following the standard protocol without IV contrast.

[Series 4: lung bases · axial · 0.68mm/px · z∈[+35,+105]mm · 4 of 24 slices shown, 9 images]
[im 5/24  soft-tissue]
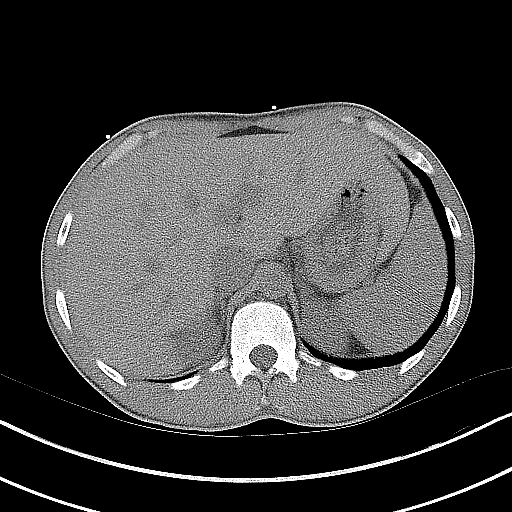
[im 5/24  lung]
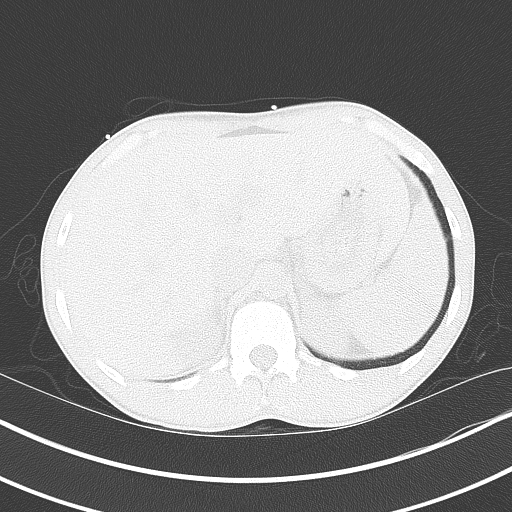
[im 5/24  bone]
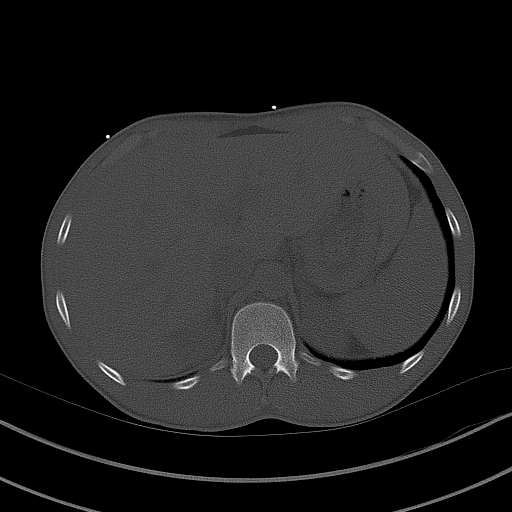
[im 10/24  soft-tissue]
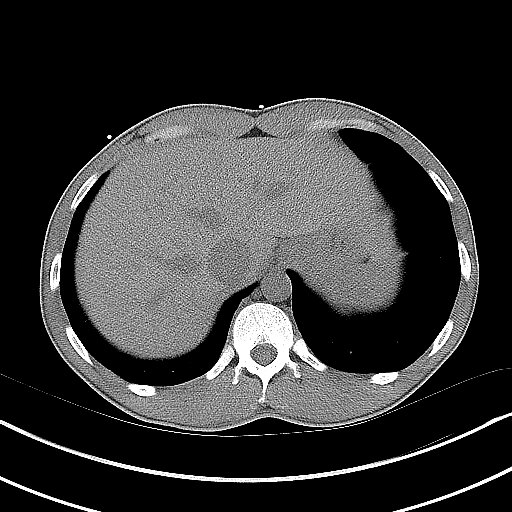
[im 10/24  lung]
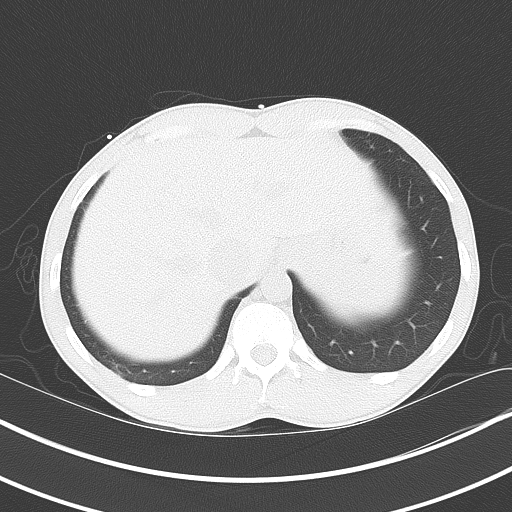
[im 14/24  soft-tissue]
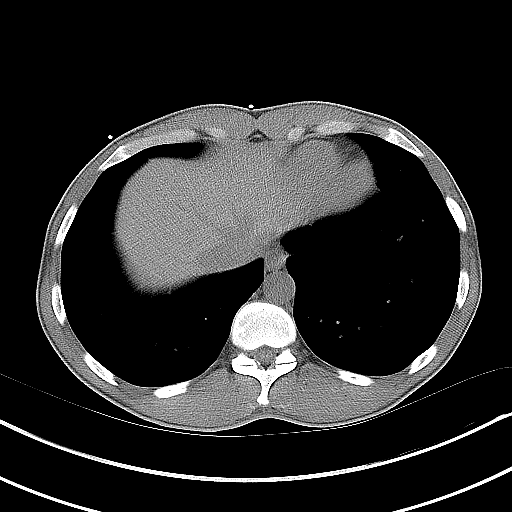
[im 14/24  lung]
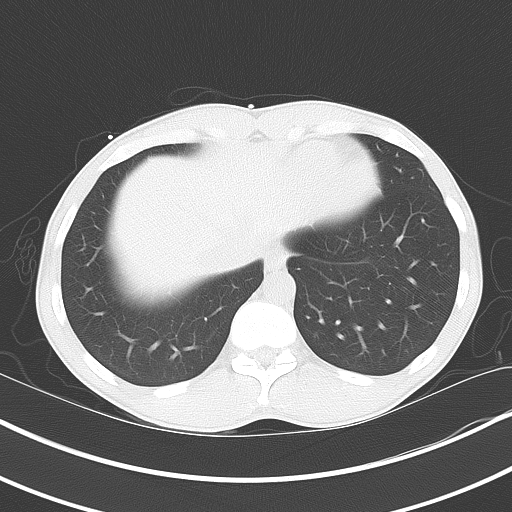
[im 19/24  soft-tissue]
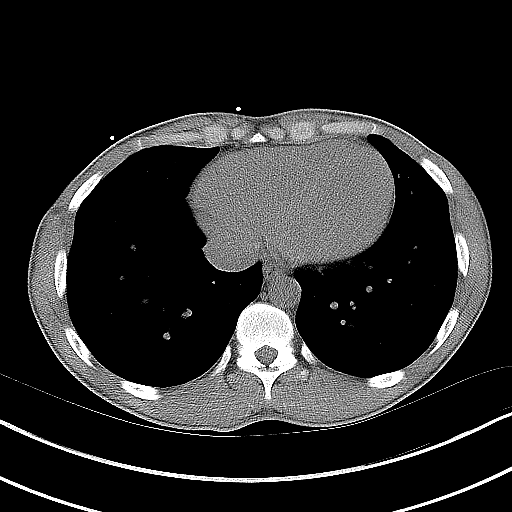
[im 19/24  lung]
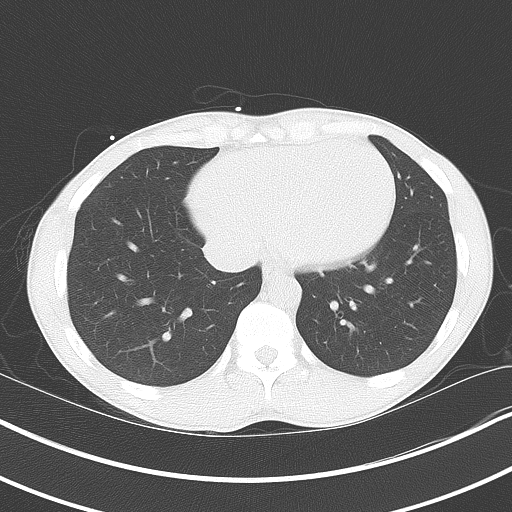

[Series 5: coronal · coronal · 0.68mm/px · 3 of 110 slices shown, 4 images]
[im 37/110  soft-tissue]
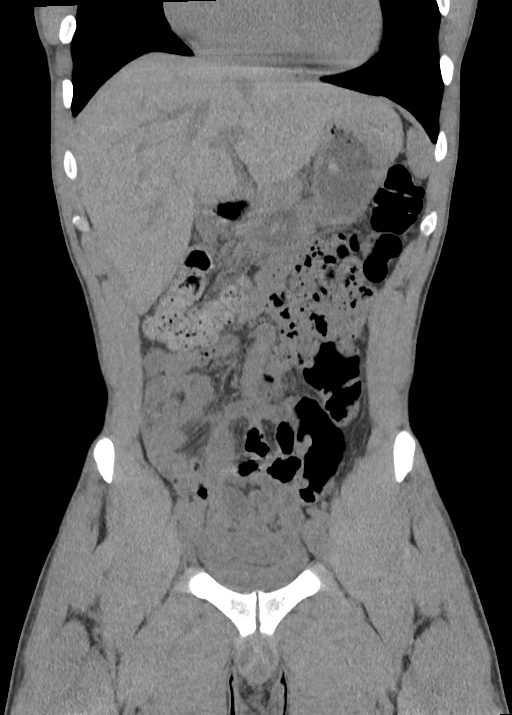
[im 49/110  soft-tissue]
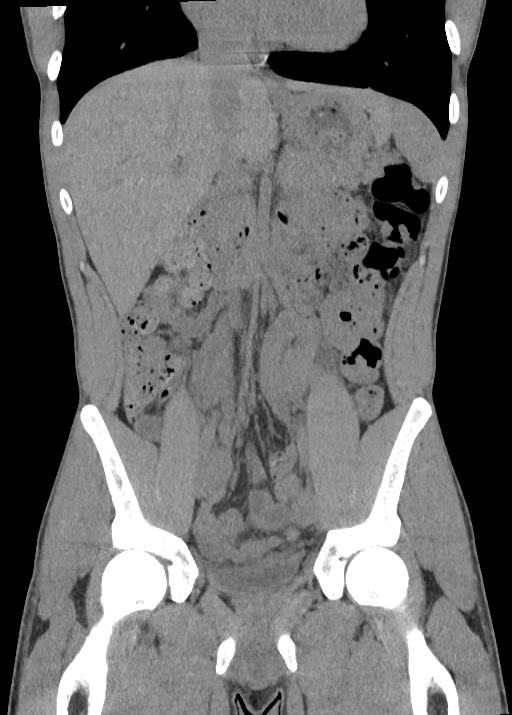
[im 49/110  bone]
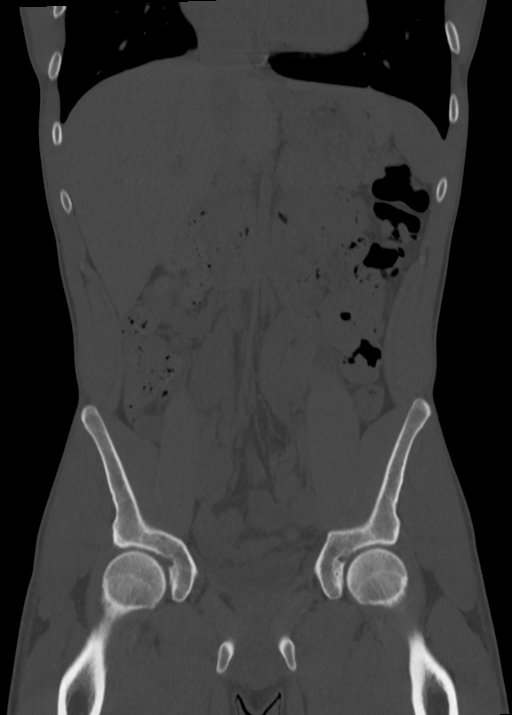
[im 61/110  soft-tissue]
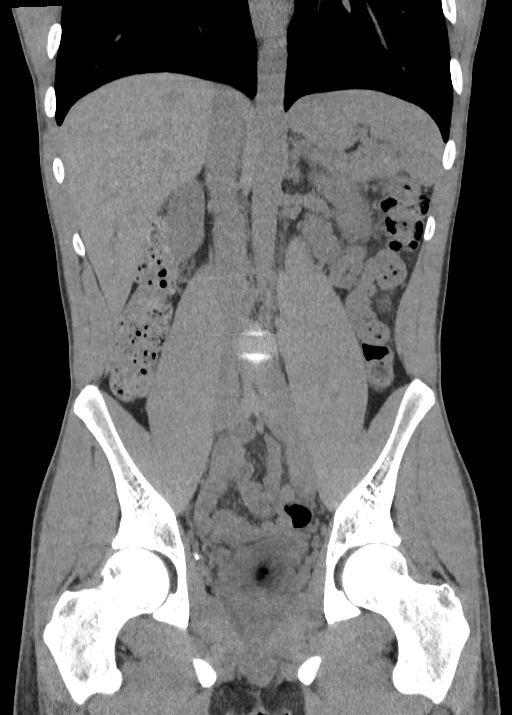

[Series 6: sagittal · sagittal · 0.45mm/px · 1 of 158 slices shown, 2 images]
[im 53/158  soft-tissue]
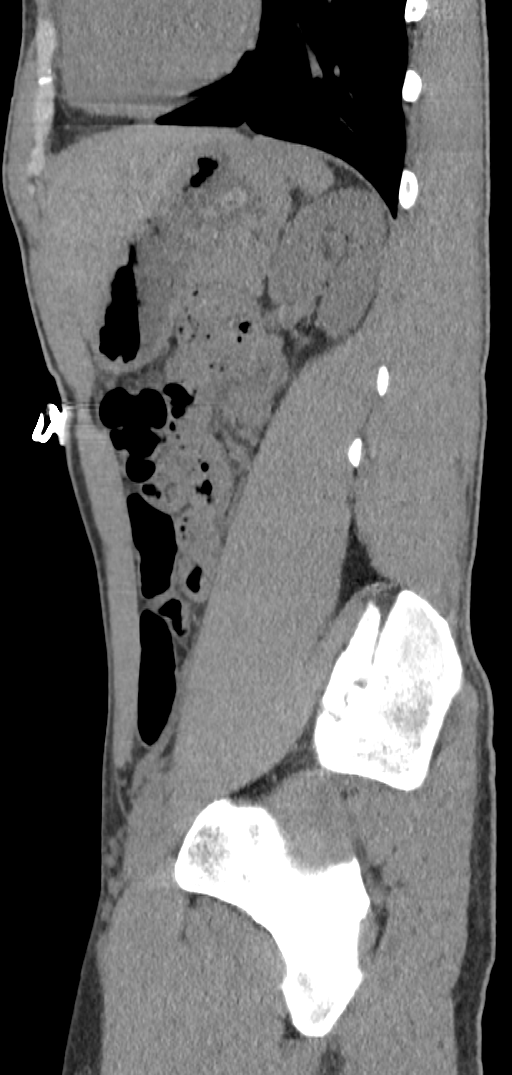
[im 53/158  bone]
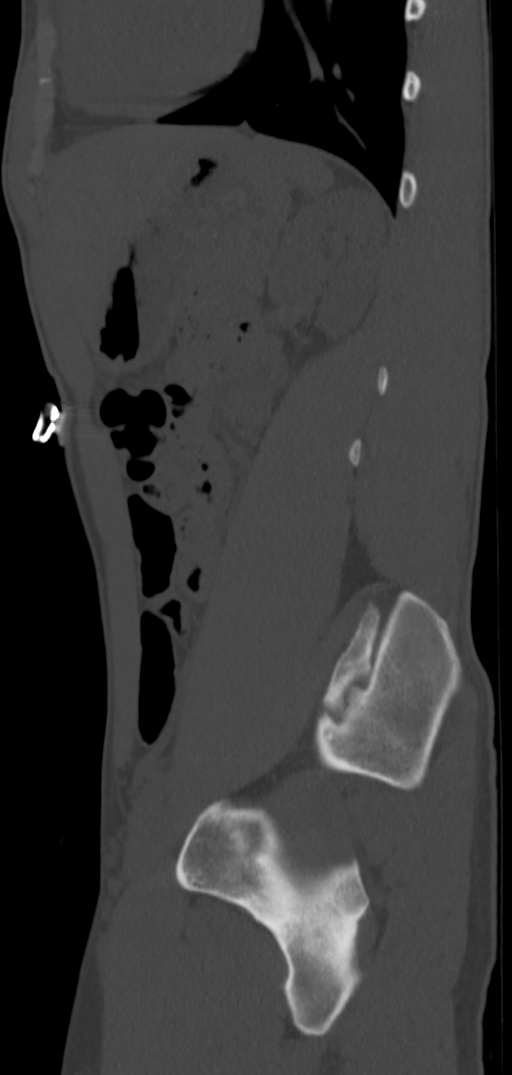

[8 of 46 positions shown; findings below may reference images not displayed]

FINDINGS: Lower chest: Lung bases are clear. Normal heart size. No pericardial
effusion.

Hepatobiliary: No focal liver abnormality is seen. No gallstones,
gallbladder wall thickening, or biliary dilatation.

Pancreas: Unremarkable. No pancreatic ductal dilatation or
surrounding inflammatory changes.

Spleen: Normal in size without focal abnormality.

Adrenals/Urinary Tract: Adrenal glands are unremarkable. Kidneys are
normal, without renal calculi, focal lesion, or hydronephrosis.
Bladder is unremarkable.

Stomach/Bowel: Distal esophagus, stomach and duodenal sweep are
unremarkable. No small bowel wall thickening or dilatation. No
evidence of obstruction. A normal appendix is visualized. No colonic
dilatation or wall thickening.

Vascular/Lymphatic: Limited noncontrast evaluation of the
vasculature reveals no acute abnormality. No suspicious or enlarged
lymph nodes in the included lymphatic chains.

Reproductive: The prostate and seminal vesicles are unremarkable.

Other: No abdominopelvic free fluid or free gas. No bowel containing
hernias.

Musculoskeletal: No acute or significant osseous findings.
IMPRESSION: No acute intra-abdominal findings to explain the patient's symptoms.

## 2020-12-28 ENCOUNTER — Emergency Department
Admission: EM | Admit: 2020-12-28 | Discharge: 2020-12-28 | Disposition: A | Discharge status: Home / Self Care | Payer: Medicaid Other | Attending: Emergency Medicine | Admitting: Emergency Medicine

## 2020-12-28 ENCOUNTER — Other Ambulatory Visit: Payer: Self-pay

## 2020-12-28 DIAGNOSIS — Z7951 Long term (current) use of inhaled steroids: Secondary | ICD-10-CM | POA: Insufficient documentation

## 2020-12-28 DIAGNOSIS — J45909 Unspecified asthma, uncomplicated: Secondary | ICD-10-CM | POA: Insufficient documentation

## 2020-12-28 DIAGNOSIS — F17210 Nicotine dependence, cigarettes, uncomplicated: Secondary | ICD-10-CM | POA: Insufficient documentation

## 2020-12-28 DIAGNOSIS — H9202 Otalgia, left ear: Secondary | ICD-10-CM | POA: Insufficient documentation

## 2020-12-28 MED ORDER — CETIRIZINE HCL 10 MG PO TABS
10.0000 mg | ORAL_TABLET | Freq: Every day | ORAL | 0 refills | Status: DC
Start: 2020-12-28 — End: 2021-02-11

## 2020-12-28 MED ORDER — FLUTICASONE PROPIONATE 50 MCG/ACT NA SUSP: 1.0000 | Freq: Every day | NASAL | 2 refills | Status: DC

## 2020-12-28 NOTE — ED Provider Notes (Signed)
[facility]-EMERGENCY DEPARTMENT  ____________________________________________  Time seen: Approximately 3:35 PM  I have reviewed the triage vital signs and the nursing notes.   HISTORY  Chief Complaint Ear Fullness   Historian Patient     HPI Evan Leblanc. is a 30 y.o. male presents to the emergency department with left ear fullness for the past 2 days.  Patient states that he has been unable to work due to discomfort.  He denies fever and chills or discharge from the ear.  States he has used Flonase for the past 24 hours but has not attempted any other alleviating measures.  No fever.  Denies history of otitis media.   Past Medical History:  Diagnosis Date   Asthma      Immunizations up to date:  Yes.     Past Medical History:  Diagnosis Date   Asthma     There are no problems to display for this patient.   Past Surgical History:  Procedure Laterality Date   FRACTURE SURGERY     left ankle    Prior to Admission medications   Medication Sig Start Date End Date Taking? Authorizing Provider  cetirizine (ZYRTEC ALLERGY) 10 MG tablet Take 1 tablet (10 mg total) by mouth daily. 12/28/20 01/27/21 Yes Pia Mau M, PA-C  fluticasone (FLONASE) 50 MCG/ACT nasal spray Place 1 spray into both nostrils daily. 12/28/20 12/28/21 Yes Pia Mau M, PA-C  ibuprofen (ADVIL) 600 MG tablet Take 1 tablet (600 mg total) by mouth every 8 (eight) hours as needed. 08/14/19   Joni Reining, PA-C  traMADol (ULTRAM) 50 MG tablet Take 1 tablet (50 mg total) by mouth every 6 (six) hours as needed for moderate pain. 08/14/19   Joni Reining, PA-C    Allergies Patient has no known allergies.  No family history on file.  Social History Social History   Tobacco Use   Smoking status: Every Day    Packs/day: 0.50    Years: 10.00    Pack years: 5.00    Types: Cigarettes   Smokeless tobacco: Never  Vaping Use   Vaping Use: Never used  Substance Use Topics   Alcohol use:  No    Comment: 1-2x/mo   Drug use: Yes    Frequency: 14.0 times per week    Types: Marijuana     Review of Systems  Constitutional: No fever/chills Eyes:  No discharge ENT: Patient has left ear pain.  Respiratory: no cough. No SOB/ use of accessory muscles to breath Gastrointestinal:   No nausea, no vomiting.  No diarrhea.  No constipation. Musculoskeletal: Negative for musculoskeletal pain. Skin: Negative for rash, abrasions, lacerations, ecchymosis.    ____________________________________________   PHYSICAL EXAM:  VITAL SIGNS: ED Triage Vitals  Enc Vitals Group     BP 12/28/20 1414 111/73     Pulse Rate 12/28/20 1414 (!) 54     Resp 12/28/20 1413 18     Temp 12/28/20 1413 98.7 F (37.1 C)     Temp Source 12/28/20 1413 Oral     SpO2 12/28/20 1414 98 %     Weight 12/28/20 1413 165 lb (74.8 kg)     Height 12/28/20 1413 6\' 1"  (1.854 m)     Head Circumference --      Peak Flow --      Pain Score 12/28/20 1413 3     Pain Loc --      Pain Edu? --      Excl. in GC? --  Constitutional: Alert and oriented. Well appearing and in no acute distress. Eyes: Conjunctivae are normal. PERRL. EOMI. Head: Atraumatic. ENT:      Ears: Patient has some cerumen impaction. Middle ear effusion visualized on the left.       Nose: No congestion/rhinnorhea.      Mouth/Throat: Mucous membranes are moist.  Neck: No stridor.  No cervical spine tenderness to palpation. Hematological/Lymphatic/Immunilogical: No cervical lymphadenopathy. Cardiovascular: Normal rate, regular rhythm. Normal S1 and S2.  Good peripheral circulation. Respiratory: Normal respiratory effort without tachypnea or retractions. Lungs CTAB. Good air entry to the bases with no decreased or absent breath sounds Gastrointestinal: Bowel sounds x 4 quadrants. Soft and nontender to palpation. No guarding or rigidity. No distention. Musculoskeletal: Full range of motion to all extremities. No obvious deformities  noted Neurologic:  Normal for age. No gross focal neurologic deficits are appreciated.  Skin:  Skin is warm, dry and intact. No rash noted. Psychiatric: Mood and affect are normal for age. Speech and behavior are normal.   ____________________________________________   LABS (all labs ordered are listed, but only abnormal results are displayed)  Labs Reviewed - No data to display ____________________________________________  EKG   ____________________________________________  RADIOLOGY   No results found.  ____________________________________________    PROCEDURES  Procedure(s) performed:     Procedures     Medications - No data to display   ____________________________________________   INITIAL IMPRESSION / ASSESSMENT AND PLAN / ED COURSE  Pertinent labs & imaging results that were available during my care of the patient were reviewed by me and considered in my medical decision making (see chart for details).      Assessment and plan Middle ear effusion 30 year old male presents to the emergency department with left ear pain for the past 2 to 3 days.  Patient was bradycardic at triage but vital signs otherwise reassuring.  He was alert, active and nontoxic-appearing.  We will treat with Zyrtec and Flonase and have patient follow-up with primary care.     ____________________________________________  FINAL CLINICAL IMPRESSION(S) / ED DIAGNOSES  Final diagnoses:  Left ear pain      NEW MEDICATIONS STARTED DURING THIS VISIT:  ED Discharge Orders          Ordered    cetirizine (ZYRTEC ALLERGY) 10 MG tablet  Daily        12/28/20 1532    fluticasone (FLONASE) 50 MCG/ACT nasal spray  Daily        12/28/20 1532                This chart was dictated using voice recognition software/Dragon. Despite best efforts to proofread, errors can occur which can change the meaning. Any change was purely unintentional.     Orvil Feil,  PA-C 12/28/20 1545    Delton Prairie, MD 12/30/20 [phone]

## 2020-12-28 NOTE — ED Notes (Signed)
See triage note. Pt in NAD, ambulatory to room. C/o L ear pain intermittent for past few weeks.

## 2020-12-28 NOTE — ED Triage Notes (Signed)
Pt reports discomfort to left ear for the past few weeks. States it is intermittent.  NAD noted Ambulatory

## 2020-12-28 NOTE — Discharge Instructions (Signed)
Take one spray of Flonase each side. Take 10 mg of Zyrtec once daily.  

## 2021-02-11 ENCOUNTER — Emergency Department
Admission: EM | Admit: 2021-02-11 | Discharge: 2021-02-11 | Disposition: A | Discharge status: Home / Self Care | Payer: Medicaid Other | Attending: Emergency Medicine | Admitting: Emergency Medicine

## 2021-02-11 ENCOUNTER — Other Ambulatory Visit: Payer: Self-pay

## 2021-02-11 ENCOUNTER — Encounter: Payer: Self-pay | Admitting: Emergency Medicine

## 2021-02-11 DIAGNOSIS — J069 Acute upper respiratory infection, unspecified: Secondary | ICD-10-CM | POA: Insufficient documentation

## 2021-02-11 DIAGNOSIS — Z7951 Long term (current) use of inhaled steroids: Secondary | ICD-10-CM | POA: Insufficient documentation

## 2021-02-11 DIAGNOSIS — F17210 Nicotine dependence, cigarettes, uncomplicated: Secondary | ICD-10-CM | POA: Insufficient documentation

## 2021-02-11 DIAGNOSIS — U071 COVID-19: Secondary | ICD-10-CM | POA: Insufficient documentation

## 2021-02-11 DIAGNOSIS — J45909 Unspecified asthma, uncomplicated: Secondary | ICD-10-CM | POA: Insufficient documentation

## 2021-02-11 LAB — RESP PANEL BY RT-PCR (FLU A&B, COVID) ARPGX2
Influenza A by PCR: NEGATIVE
Influenza B by PCR: NEGATIVE
SARS Coronavirus 2 by RT PCR: POSITIVE — AB

## 2021-02-11 MED ORDER — IBUPROFEN 600 MG PO TABS
600.0000 mg | ORAL_TABLET | Freq: Once | ORAL | Status: AC
  Administered 2021-02-11: 600 mg via ORAL
  Filled 2021-02-11: qty 1

## 2021-02-11 MED ORDER — HYDROCOD POLST-CPM POLST ER 10-8 MG/5ML PO SUER: 5.0000 mL | Freq: Two times a day (BID) | ORAL | 0 refills | Status: AC

## 2021-02-11 NOTE — Discharge Instructions (Signed)
You have been seen in the emergency room today for a viral illness.  It is unknown at this time whether you have COVID, flu, other viral illness.  Please view your MyChart throughout the afternoon to see if you have tested positive for COVID or the flu.  If you test positive for the flu you will not be able to return to work until you have been fever free for at least 24 hours if you test positive for COVID you will need to quarantine for 5 days.  And after that time if you still remain with fever or symptoms you must quarantine for the full 10 days.

## 2021-02-11 NOTE — ED Triage Notes (Signed)
C/O three day history of chills, sore throat, fatigue, and today vomited x 1.

## 2021-02-11 NOTE — ED Provider Notes (Signed)
[facility] Emergency Department Provider Note   ____________________________________________   Event Date/Time   First MD Initiated Contact with Patient 02/11/21 2042     (approximate)  I have reviewed the triage vital signs and the nursing notes.   HISTORY  Chief Complaint URI    HPI Evan Leblanc. is a 31 y.o. male patient presents to the emergency room with URI symptoms for the past 4 days.  Patient states that he has had cough, runny nose, congestion, body aches, fever.  Patient reports for the past 24 hours he has had nausea and vomiting.  Patient denies abdominal pain, ear pain, postnasal drip, diarrhea.  Patient also denies any chest pain, shortness of breath, dyspnea on exertion, palpitations, dizziness.  Patient reports his body aches as a dull aching pain and a 8 out of 10.  This pain is constant in nature.  Patient reports that he is taking ibuprofen with moderate control of symptoms.  His last dose was approximately 7 hours ago.  Past Medical History:  Diagnosis Date   Asthma     There are no problems to display for this patient.   Past Surgical History:  Procedure Laterality Date   FRACTURE SURGERY     left ankle    Prior to Admission medications   Medication Sig Start Date End Date Taking? Authorizing Provider  chlorpheniramine-HYDROcodone (TUSSIONEX PENNKINETIC ER) 10-8 MG/5ML SUER Take 5 mLs by mouth 2 (two) times daily for 5 days. 02/11/21 02/16/21 Yes Herschell Dimes, NP  fluticasone (FLONASE) 50 MCG/ACT nasal spray Place 1 spray into both nostrils daily. 12/28/20 12/28/21  Orvil Feil, PA-C  ibuprofen (ADVIL) 600 MG tablet Take 1 tablet (600 mg total) by mouth every 8 (eight) hours as needed. 08/14/19   Joni Reining, PA-C  traMADol (ULTRAM) 50 MG tablet Take 1 tablet (50 mg total) by mouth every 6 (six) hours as needed for moderate pain. 08/14/19   Joni Reining, PA-C    Allergies Patient has no known  allergies.  No family history on file.  Social History Social History   Tobacco Use   Smoking status: Every Day    Packs/day: 0.50    Years: 10.00    Pack years: 5.00    Types: Cigarettes   Smokeless tobacco: Never  Vaping Use   Vaping Use: Never used  Substance Use Topics   Alcohol use: No    Comment: 1-2x/mo   Drug use: Yes    Frequency: 14.0 times per week    Types: Marijuana    Review of Systems  Constitutional: Positive for fever and chills Eyes: No visual changes. ENT: Positive for sore throat, runny nose Cardiovascular: Denies chest pain. Respiratory: Denies shortness of breath.  Positive for cough Gastrointestinal: No abdominal pain. No diarrhea.  No constipation.  Positive for nausea vomiting Genitourinary: Negative for dysuria. Musculoskeletal: Positive for body aches Skin: Negative for rash. Neurological: Negative for headaches, focal weakness or numbness.   ____________________________________________   PHYSICAL EXAM:  VITAL SIGNS: ED Triage Vitals  Enc Vitals Group     BP 02/11/21 1913 105/72     Pulse Rate 02/11/21 1913 89     Resp 02/11/21 1913 16     Temp 02/11/21 1913 (!) 100.6 F (38.1 C)     Temp Source 02/11/21 1913 Oral     SpO2 02/11/21 1913 92 %     Weight 02/11/21 1854 164 lb 14.5 oz (74.8 kg)  Height 02/11/21 1854 6\' 1"  (1.854 m)     Head Circumference --      Peak Flow --      Pain Score 02/11/21 1854 8     Pain Loc --      Pain Edu? --      Excl. in GC? --     Constitutional: Alert and oriented. Well appearing and in no acute distress. Eyes: Conjunctivae are normal. PERRL. EOMI. Head: Atraumatic. Nose: Positive for congestion/runny nose Mouth/Throat: Mucous membranes are moist.  Oropharynx non-erythematous.  No tonsillar enlargement Neck: No stridor.   Hematological/Lymphatic/Immunilogical: Positive cervical lymphadenopathy. Cardiovascular: Normal rate, regular rhythm. Grossly normal heart sounds.  Good peripheral  circulation. Respiratory: Normal respiratory effort.  No retractions. Lungs CTAB. Gastrointestinal: Soft and nontender. No distention. No abdominal bruits. No CVA tenderness. Musculoskeletal: No lower extremity tenderness nor edema.  No joint effusions. Neurologic:  Normal speech and language. No gross focal neurologic deficits are appreciated. No gait instability. Skin:  Skin is warm, dry and intact. No rash noted. Psychiatric: Mood and affect are normal. Speech and behavior are normal.  ____________________________________________   LABS (all labs ordered are listed, but only abnormal results are displayed)  Labs Reviewed  RESP PANEL BY RT-PCR (FLU A&B, COVID) [id number]   ____________________________________________  EKG   ____________________________________________  RADIOLOGY  ED MD interpretation:    Official radiology report(s): No results found.  ____________________________________________   PROCEDURES  Procedure(s) performed: None  Procedures  Critical Care performed: No  ____________________________________________   INITIAL IMPRESSION / ASSESSMENT AND PLAN / ED COURSE     Patient presents to the emergency room with 4 days of URI symptoms.  Please see HPI for full history. We will plan to give patient ibuprofen for fever while here in the emergency room. Will obtain respiratory panel to test for flu and COVID. We will discharge patient home to review results of flu and COVID test when they result. Have discussed with patient proper quarantine for COVID and or flu if they are positive. I will provide patient with a work note as he has missed the past 2 days of work and will need to miss at least the next 24 hours as he has a fever today in the ER. Will be discharged home and stable condition at this time.      ____________________________________________   FINAL CLINICAL IMPRESSION(S) / ED DIAGNOSES  Final diagnoses:  Viral URI with cough      ED Discharge Orders          Ordered    chlorpheniramine-HYDROcodone (TUSSIONEX PENNKINETIC ER) 10-8 MG/5ML SUER  2 times daily        02/11/21 2105             Note:  This document was prepared using Dragon voice recognition software and may include unintentional dictation errors.     2106, NP 02/11/21 2106    2107, MD 02/12/21 [phone]

## 2021-09-28 ENCOUNTER — Other Ambulatory Visit: Payer: Self-pay

## 2021-09-28 ENCOUNTER — Emergency Department: Payer: Medicaid Other

## 2021-09-28 ENCOUNTER — Emergency Department
Admission: EM | Admit: 2021-09-28 | Discharge: 2021-09-29 | Disposition: A | Discharge status: Home / Self Care | Payer: Medicaid Other | Attending: Emergency Medicine | Admitting: Emergency Medicine

## 2021-09-28 DIAGNOSIS — M25561 Pain in right knee: Secondary | ICD-10-CM | POA: Insufficient documentation

## 2021-09-28 DIAGNOSIS — S86911A Strain of unspecified muscle(s) and tendon(s) at lower leg level, right leg, initial encounter: Secondary | ICD-10-CM

## 2021-09-28 DIAGNOSIS — Y9301 Activity, walking, marching and hiking: Secondary | ICD-10-CM | POA: Insufficient documentation

## 2021-09-28 DIAGNOSIS — X501XXA Overexertion from prolonged static or awkward postures, initial encounter: Secondary | ICD-10-CM | POA: Insufficient documentation

## 2021-09-28 NOTE — ED Triage Notes (Signed)
To triage via wheelchair with c/o right knee pain. Slipped off curb and twisted knee. Injury occurred apx 45 mins ago.

## 2021-09-28 NOTE — ED Provider Notes (Signed)
[facility] Provider Note    Event Date/Time   First MD Initiated Contact with Patient 09/28/21 2347     (approximate)   History   Knee Pain   HPI  Evan Leblanc. is a 31 y.o. male who presents to the ED for evaluation of Knee Pain   Patient presents to the ED for evaluation of acute right knee pain in the past hour.  He was walking over a curb when he tripped and twisted/"tweaked" his right knee.  He reports catching himself with his hands and not striking his head or hitting his knee on the ground.  Reports primarily just a twisting motion with subsequent pain globally over his right knee and difficulty ambulating due to pain.   Physical Exam   Triage Vital Signs: ED Triage Vitals  Enc Vitals Group     BP 09/28/21 2049 107/68     Pulse Rate 09/28/21 2049 75     Resp 09/28/21 2049 18     Temp 09/28/21 2049 97.8 F (36.6 C)     Temp Source 09/28/21 2049 Oral     SpO2 09/28/21 2049 99 %     Weight 09/28/21 2048 165 lb (74.8 kg)     Height 09/28/21 2048 6\' 1"  (1.854 m)     Head Circumference --      Peak Flow --      Pain Score 09/28/21 2050 10     Pain Loc --      Pain Edu? --      Excl. in GC? --     Most recent vital signs: Vitals:   09/28/21 2049 09/29/21 0024  BP: 107/68 (!) 141/62  Pulse: 75 75  Resp: 18 18  Temp: 97.8 F (36.6 C) 98 F (36.7 C)  SpO2: 99% 99%    General: Awake, no distress.  CV:  Good peripheral perfusion.  Resp:  Normal effort.  Abd:  No distention.  MSK:  No deformity noted.  Minimal soft tissue swelling without significant effusion yet.  No abrasion, laceration or signs of trauma otherwise.  Diffuse tenderness medially and laterally along the joint line.  No patellar or patellar tendon tenderness.  Range of motion intact, but with pain. Neuro:  No focal deficits appreciated. Other:     ED Results / Procedures / Treatments   Labs (all labs ordered are listed, but only abnormal results are  displayed) Labs Reviewed - No data to display  EKG   RADIOLOGY Plain film of the right knee interpreted by me with no evidence of fracture or dislocation  Official radiology report(s): DG Knee 2 Views Right  Result Date: 09/28/2021 CLINICAL DATA:  Trip and fall with knee pain, initial encounter EXAM: RIGHT KNEE - 2 VIEW COMPARISON:  None Available. FINDINGS: No evidence of fracture, dislocation, or joint effusion. No evidence of arthropathy or other focal bone abnormality. Soft tissues are unremarkable. IMPRESSION: No acute abnormality noted. Electronically Signed   By: 09/30/2021 M.D.   On: 09/28/2021 21:19    PROCEDURES and INTERVENTIONS:  Procedures  Medications  ketorolac (TORADOL) 30 MG/ML injection 30 mg (30 mg Intramuscular Given 09/29/21 0018)     IMPRESSION / MDM / ASSESSMENT AND PLAN / ED COURSE  I reviewed the triage vital signs and the nursing notes.  Differential diagnosis includes, but is not limited to, patellar fracture, patellar or knee dislocation, tibial plateau fracture, soft tissue strain or spasm  Generally healthy young man presents to  the ED after twisting his right knee with evidence of a soft tissue sprain and suitable for outpatient management with orthopedic follow-up.  He looks systemically well.  Tender right knee with a small effusion that is probably starting to develop.  Suspect ligamentous strain of the possible meniscus injury.  X-rays reassuring.  Provided knee immobilizer, crutches and orthopedic outpatient follow-up.  Discussed RICE therapies and NSAIDs.  Discussed return precautions.  Provided work note.     FINAL CLINICAL IMPRESSION(S) / ED DIAGNOSES   Final diagnoses:  Acute pain of right knee  Strain of right knee, initial encounter     Rx / DC Orders   ED Discharge Orders     None        Note:  This document was prepared using Dragon voice recognition software and may include unintentional dictation errors.   Delton [place],  MD 09/29/21 [phone]

## 2021-09-29 MED ORDER — KETOROLAC TROMETHAMINE 30 MG/ML IJ SOLN
30.0000 mg | Freq: Once | INTRAMUSCULAR | Status: AC
  Administered 2021-09-29: 30 mg via INTRAMUSCULAR
  Filled 2021-09-29: qty 1

## 2021-09-29 NOTE — Discharge Instructions (Signed)
Please take Tylenol and ibuprofen/Advil for your pain.  It is safe to take them together, or to alternate them every few hours.  Take up to 1000mg  of Tylenol at a time, up to 4 times per day.  Do not take more than 4000 mg of Tylenol in 24 hours.  For ibuprofen, take 400-600 mg, 3 - 4 times per day.  Keep the knee immobilizer/brace on your knee at all times.  You can take it off while bathing or changing clothes, but be careful not to tweak your knee again as it is trying to heal.  I would wear the brace while sleeping as well.  Follow-up with the orthopedic doctor in the clinic after 1-2 weeks for recheck.  Keep the knee in the brace until you follow-up with them and get updated recommendations.

## 2021-09-29 NOTE — ED Notes (Signed)
Pt Dc to home. Instructions reviewed with all questions answered. Pt verbalizes understanding. Pt assisted out of dept via wheelchair with staff and family member.

## 2021-10-01 ENCOUNTER — Emergency Department
Admission: EM | Admit: 2021-10-01 | Discharge: 2021-10-01 | Disposition: A | Discharge status: Home / Self Care | Payer: Self-pay | Attending: Emergency Medicine | Admitting: Emergency Medicine

## 2021-10-01 ENCOUNTER — Encounter: Payer: Self-pay | Admitting: Emergency Medicine

## 2021-10-01 ENCOUNTER — Other Ambulatory Visit: Payer: Self-pay

## 2021-10-01 DIAGNOSIS — M25561 Pain in right knee: Secondary | ICD-10-CM | POA: Insufficient documentation

## 2021-10-01 MED ORDER — CYCLOBENZAPRINE HCL 5 MG PO TABS: 5.0000 mg | ORAL_TABLET | Freq: Three times a day (TID) | ORAL | 0 refills | Status: DC | PRN

## 2021-10-01 MED ORDER — CYCLOBENZAPRINE HCL 10 MG PO TABS
10.0000 mg | ORAL_TABLET | Freq: Once | ORAL | Status: AC
  Administered 2021-10-01: 10 mg via ORAL
  Filled 2021-10-01: qty 1

## 2021-10-01 MED ORDER — IBUPROFEN 800 MG PO TABS
800.0000 mg | ORAL_TABLET | Freq: Once | ORAL | Status: AC
  Administered 2021-10-01: 800 mg via ORAL
  Filled 2021-10-01: qty 1

## 2021-10-01 MED ORDER — IBUPROFEN 800 MG PO TABS: 800.0000 mg | ORAL_TABLET | Freq: Three times a day (TID) | ORAL | 0 refills | Status: DC | PRN

## 2021-10-01 NOTE — ED Provider Notes (Signed)
    [facility] Emergency Department Provider Note     Event Date/Time   First MD Initiated Contact with Patient 10/01/21 1529     (approximate)   History   Knee Pain   HPI  Evan Leblanc. is a 31 y.o. male returns to the ED for evaluation of continued right knee pain. He was evaluated with XRs and a knee brace. He was not discharged with any meds after a dose of Toradol. He denies reinjury in the interim. He denies any history of previous or chronic knee problems.    Physical Exam   Triage Vital Signs: ED Triage Vitals  Enc Vitals Group     BP 10/01/21 1338 (!) 105/58     Pulse Rate 10/01/21 1338 60     Resp 10/01/21 1338 20     Temp 10/01/21 1338 98.1 F (36.7 C)     Temp Source 10/01/21 1338 Oral     SpO2 10/01/21 1338 98 %     Weight 10/01/21 1317 165 lb 5.5 oz (75 kg)     Height 10/01/21 1317 6\' 1"  (1.854 m)     Head Circumference --      Peak Flow --      Pain Score 10/01/21 1317 9     Pain Loc --      Pain Edu? --      Excl. in GC? --     Most recent vital signs: Vitals:   10/01/21 1338  BP: (!) 105/58  Pulse: 60  Resp: 20  Temp: 98.1 F (36.7 C)  SpO2: 98%    General Awake, no distress. *** {**HEENT NCAT. PERRL. EOMI. No rhinorrhea. Mucous membranes are moist. **} CV:  Good peripheral perfusion. *** RESP:  Normal effort. *** ABD:  No distention. *** {**Other: **}   ED Results / Procedures / Treatments   Labs (all labs ordered are listed, but only abnormal results are displayed) Labs Reviewed - No data to display   EKG    RADIOLOGY   No results found.   PROCEDURES:  Critical Care performed: No  Procedures   MEDICATIONS ORDERED IN ED: Medications - No data to display   IMPRESSION / MDM / ASSESSMENT AND PLAN / ED COURSE  I reviewed the triage vital signs and the nursing notes.                              Differential diagnosis includes, but is not limited to, knee sprain, knee strain,  contusion, joint effusion  Patient's presentation is most consistent with acute, uncomplicated illness.  Patient's diagnosis is consistent with ***. Patient will be discharged home with prescriptions for ***. Patient is to follow up with *** as needed or otherwise directed. Patient is given ED precautions to return to the ED for any worsening or new symptoms.     FINAL CLINICAL IMPRESSION(S) / ED DIAGNOSES   Final diagnoses:  Acute pain of right knee     Rx / DC Orders   ED Discharge Orders     None        Note:  This document was prepared using Dragon voice recognition software and may include unintentional dictation errors.

## 2021-10-01 NOTE — Discharge Instructions (Addendum)
Take the prescription anti-inflammatory as directed and the muscle relaxant as needed. Apply ice to reduce pain and swelling. Follow-up with Ortho as planned.

## 2021-10-01 NOTE — ED Triage Notes (Signed)
Pt reports was seen here for a knee injury on 8/31, was given meds but the pain is not getting better. Pt reports pain is worse now, denies re injuries.

## 2021-10-01 NOTE — ED Notes (Signed)
Patient declined discharge vital signs. 

## 2021-10-22 ENCOUNTER — Other Ambulatory Visit: Payer: Self-pay

## 2021-10-22 ENCOUNTER — Emergency Department
Admission: EM | Admit: 2021-10-22 | Discharge: 2021-10-22 | Disposition: A | Discharge status: Home / Self Care | Payer: Self-pay | Attending: Emergency Medicine | Admitting: Emergency Medicine

## 2021-10-22 ENCOUNTER — Encounter: Payer: Self-pay | Admitting: Emergency Medicine

## 2021-10-22 DIAGNOSIS — M25561 Pain in right knee: Secondary | ICD-10-CM | POA: Insufficient documentation

## 2021-10-22 DIAGNOSIS — J45909 Unspecified asthma, uncomplicated: Secondary | ICD-10-CM | POA: Insufficient documentation

## 2021-10-22 DIAGNOSIS — G8929 Other chronic pain: Secondary | ICD-10-CM

## 2021-10-22 MED ORDER — KETOROLAC TROMETHAMINE 30 MG/ML IJ SOLN
30.0000 mg | Freq: Once | INTRAMUSCULAR | Status: AC
  Administered 2021-10-22: 30 mg via INTRAMUSCULAR
  Filled 2021-10-22: qty 1

## 2021-10-22 MED ORDER — KETOROLAC TROMETHAMINE 10 MG PO TABS: 10.0000 mg | ORAL_TABLET | Freq: Four times a day (QID) | ORAL | 0 refills | Status: DC | PRN

## 2021-10-22 MED ORDER — LIDOCAINE 5 % EX PTCH
1.0000 | MEDICATED_PATCH | Freq: Two times a day (BID) | CUTANEOUS | 0 refills | Status: AC
Start: 2021-10-22 — End: 2022-10-22

## 2021-10-22 MED ORDER — LIDOCAINE 5 % EX PTCH
1.0000 | MEDICATED_PATCH | CUTANEOUS | Status: DC
  Administered 2021-10-22: 1 via TRANSDERMAL
  Filled 2021-10-22: qty 1

## 2021-10-22 NOTE — ED Provider Notes (Signed)
[facility] Provider Note    Event Date/Time   First MD Initiated Contact with Patient 10/22/21 1728     (approximate)   History   Chief Complaint Knee Pain   HPI  Evan Leblanc. is a 31 y.o. male with past medical history of asthma who presents to the ED complaining of knee pain.  Patient reports that he initially injured his knee about 1 month ago when he twisted it while walking.  He states he has been dealing with intermittent flareups of pain since then, has not noticed any redness or swelling and denies any new injuries.  He states he has been wearing a brace at times and has taken ibuprofen occasionally with partial relief.  He states his pain seemed to flareup last night after he got off of work.  He has not yet seen orthopedics for this problem.  He does report feeling like his knee is popping at times.     Physical Exam   Triage Vital Signs: ED Triage Vitals  Enc Vitals Group     BP 10/22/21 1619 124/74     Pulse Rate 10/22/21 1619 (!) 56     Resp 10/22/21 1619 19     Temp 10/22/21 1619 98.2 F (36.8 C)     Temp Source 10/22/21 1619 Oral     SpO2 10/22/21 1619 93 %     Weight 10/22/21 1614 165 lb 5.5 oz (75 kg)     Height 10/22/21 1614 6\' 1"  (1.854 m)     Head Circumference --      Peak Flow --      Pain Score 10/22/21 1614 10     Pain Loc --      Pain Edu? --      Excl. in [place]? --     Most recent vital signs: Vitals:   10/22/21 1619  BP: 124/74  Pulse: (!) 56  Resp: 19  Temp: 98.2 F (36.8 C)  SpO2: 93%    Constitutional: Alert and oriented. Eyes: Conjunctivae are normal. Head: Atraumatic. Nose: No congestion/rhinnorhea. Mouth/Throat: Mucous membranes are moist.  Cardiovascular: Normal rate, regular rhythm. Grossly normal heart sounds.  2+ radial and DP pulses bilaterally. Respiratory: Normal respiratory effort.  No retractions. Lungs CTAB. Gastrointestinal: Soft and nontender. No distention. Musculoskeletal:  Diffuse tenderness to palpation at right knee with no obvious deformity or edema noted.  Range of motion intact with mild to moderate pain.  No erythema or warmth noted. Neurologic:  Normal speech and language. No gross focal neurologic deficits are appreciated.    ED Results / Procedures / Treatments   Labs (all labs ordered are listed, but only abnormal results are displayed) Labs Reviewed - No data to display   PROCEDURES:  Critical Care performed: No  Procedures   MEDICATIONS ORDERED IN ED: Medications  ketorolac (TORADOL) 30 MG/ML injection 30 mg (has no administration in time range)  lidocaine (LIDODERM) 5 % 1 patch (has no administration in time range)     IMPRESSION / MDM / ASSESSMENT AND PLAN / ED COURSE  I reviewed the triage vital signs and the nursing notes.                              31 y.o. male with no significant past medical history who presents to the ED complaining of flare up of right knee pain since last night that he has been dealing  with intermittently for the past month.  Patient's presentation is most consistent with acute, uncomplicated illness.  Differential diagnosis includes, but is not limited to, fracture, dislocation, osteoarthritis, ligamentous injury, meniscus injury.  Patient nontoxic-appearing and in no acute distress, vital signs are unremarkable and he is neurovascularly intact in his distal right lower extremity.  With no new traumatic injury, do not feel repeat imaging is indicated at this time.  Symptoms are concerning for potential meniscus or ligamentous injury and I emphasized to the patient that he will need to follow-up outpatient with orthopedics.  He continues to wear knee immobilizer as needed, will treat with IM Toradol and Lidoderm patch.  He was counseled to return to the ED for any new or worsening symptoms, patient agrees with plan.      FINAL CLINICAL IMPRESSION(S) / ED DIAGNOSES   Final diagnoses:  Chronic pain of  right knee     Rx / DC Orders   ED Discharge Orders          Ordered    ketorolac (TORADOL) 10 MG tablet  Every 6 hours PRN        10/22/21 1744    lidocaine (LIDODERM) 5 %  Every 12 hours        10/22/21 1744             Note:  This document was prepared using Dragon voice recognition software and may include unintentional dictation errors.   Blake Divine, MD 10/22/21 [phone]

## 2021-10-22 NOTE — ED Triage Notes (Signed)
Pt reports pain to his right knee for awhile. Pt reports has been seen here several times for the same but it is on better. Pt states he was advised to follow up with someone but admits that he has not attempted to do that. Denies re-injury

## 2021-11-09 ENCOUNTER — Emergency Department
Admission: EM | Admit: 2021-11-09 | Discharge: 2021-11-09 | Disposition: A | Discharge status: Home / Self Care | Payer: Self-pay

## 2021-11-09 DIAGNOSIS — Y990 Civilian activity done for income or pay: Secondary | ICD-10-CM | POA: Insufficient documentation

## 2021-11-09 DIAGNOSIS — J45909 Unspecified asthma, uncomplicated: Secondary | ICD-10-CM | POA: Insufficient documentation

## 2021-11-09 DIAGNOSIS — X501XXA Overexertion from prolonged static or awkward postures, initial encounter: Secondary | ICD-10-CM | POA: Insufficient documentation

## 2021-11-09 DIAGNOSIS — M25561 Pain in right knee: Secondary | ICD-10-CM | POA: Insufficient documentation

## 2021-11-10 ENCOUNTER — Emergency Department: Payer: Medicaid Other

## 2021-11-10 ENCOUNTER — Emergency Department
Admission: EM | Admit: 2021-11-10 | Discharge: 2021-11-10 | Disposition: A | Discharge status: Home / Self Care | Payer: Medicaid Other | Attending: Emergency Medicine | Admitting: Emergency Medicine

## 2021-11-10 ENCOUNTER — Other Ambulatory Visit: Payer: Self-pay

## 2021-11-10 DIAGNOSIS — M25561 Pain in right knee: Secondary | ICD-10-CM

## 2021-11-10 MED ORDER — IBUPROFEN 800 MG PO TABS
800.0000 mg | ORAL_TABLET | Freq: Once | ORAL | Status: AC
  Administered 2021-11-10: 800 mg via ORAL
  Filled 2021-11-10: qty 1

## 2021-11-10 MED ORDER — HYDROCODONE-ACETAMINOPHEN 5-325 MG PO TABS: 1.0000 | ORAL_TABLET | Freq: Four times a day (QID) | ORAL | 0 refills | Status: DC | PRN

## 2021-11-10 MED ORDER — HYDROCODONE-ACETAMINOPHEN 5-325 MG PO TABS
1.0000 | ORAL_TABLET | Freq: Once | ORAL | Status: AC
  Administered 2021-11-10: 1 via ORAL
  Filled 2021-11-10: qty 1

## 2021-11-10 MED ORDER — ONDANSETRON 4 MG PO TBDP
4.0000 mg | ORAL_TABLET | Freq: Once | ORAL | Status: AC
Start: 2021-11-10 — End: 2021-11-10
  Administered 2021-11-10: 4 mg via ORAL
  Filled 2021-11-10: qty 1

## 2021-11-10 MED ORDER — ONDANSETRON 4 MG PO TBDP: 4.0000 mg | ORAL_TABLET | Freq: Four times a day (QID) | ORAL | 0 refills | Status: DC | PRN

## 2021-11-10 NOTE — ED Notes (Signed)
Pt verbalized understanding of discharge instructions, prescriptions, and follow-up care instructions. Pt states he has friend to come pick him up. Paper signature obtain for discharge paperwork, see paper chart.

## 2021-11-10 NOTE — Discharge Instructions (Addendum)
Please continue wearing your knee sleeve to help with compression.  Please continue your ibuprofen 800 mg every 8 hours.  I recommend close follow-up with orthopedic surgery if symptoms or not improving with rest, elevation, ice, anti-inflammatories.  Your x-ray showed no acute abnormality today but you may need an MRI of your knee as an outpatient if symptoms or not improving.   You are being provided a prescription for opiates (also known as narcotics) for pain control.  Opiates can be addictive and should only be used when absolutely necessary for pain control when other alternatives do not work.  We recommend you only use them for the recommended amount of time and only as prescribed.  Please do not take with other sedative medications or alcohol.  Please do not drive, operate machinery, make important decisions while taking opiates.  Please note that these medications can be addictive and have high abuse potential.  Patients can become addicted to narcotics after only taking them for a few days.  Please keep these medications locked away from children, teenagers or any family members with history of substance abuse.  Narcotic pain medicine may also make you constipated.  You may use over-the-counter medications such as MiraLAX, Colace to prevent constipation.  If you become constipated, you may use over-the-counter enemas as needed.  Itching and nausea are also common side effects of narcotic pain medication.  If you develop uncontrolled vomiting or a rash, please stop these medications and seek medical care.

## 2021-11-10 NOTE — ED Provider Notes (Signed)
[facility] Provider Note    Event Date/Time   First MD Initiated Contact with Patient 11/10/21 0102     (approximate)   History   Knee Pain   HPI  Evan Leblanc. is a 31 y.o. male with no significant past medical history who presents to the emergency department with right knee pain for the past 2 months.  States he twisted it about 2 months ago and he has had pain intermittently since.  No new injury.  States he works 12-hour shifts with lots of standing.  Pain is worse just proximal to the patella.  Has been wearing a knee sleeve and using 800 mg ibuprofen and muscle relaxers with some relief.  Able to ambulate without difficulty.   History provided by patient.    Past Medical History:  Diagnosis Date   Asthma     Past Surgical History:  Procedure Laterality Date   FRACTURE SURGERY     left ankle    MEDICATIONS:  Prior to Admission medications   Medication Sig Start Date End Date Taking? Authorizing Provider  cyclobenzaprine (FLEXERIL) 5 MG tablet Take 1 tablet (5 mg total) by mouth 3 (three) times daily as needed. 10/01/21   Menshew, Charlesetta Ivory, PA-C  fluticasone (FLONASE) 50 MCG/ACT nasal spray Place 1 spray into both nostrils daily. 12/28/20 12/28/21  Orvil Feil, PA-C  ketorolac (TORADOL) 10 MG tablet Take 1 tablet (10 mg total) by mouth every 6 (six) hours as needed. 10/22/21   Chesley Noon, MD  lidocaine (LIDODERM) 5 % Place 1 patch onto the skin every 12 (twelve) hours. Remove & Discard patch within 12 hours or as directed by MD 10/22/21 10/22/22  Chesley Noon, MD    Physical Exam   Triage Vital Signs: ED Triage Vitals  Enc Vitals Group     BP 11/10/21 0021 113/75     Pulse Rate 11/10/21 0021 (!) 52     Resp 11/10/21 0021 20     Temp 11/10/21 0021 98.3 F (36.8 C)     Temp Source 11/10/21 0021 Oral     SpO2 11/10/21 0021 97 %     Weight 11/10/21 0019 165 lb (74.8 kg)     Height 11/10/21 0019 6\' 1"  (1.854 m)      Head Circumference --      Peak Flow --      Pain Score 11/10/21 0019 9     Pain Loc --      Pain Edu? --      Excl. in [place]? --     Most recent vital signs: Vitals:   11/10/21 0021 11/10/21 0208  BP: 113/75 117/66  Pulse: (!) 52 (!) 55  Resp: 20 19  Temp: 98.3 F (36.8 C) 97.6 F (36.4 C)  SpO2: 97% 98%     CONSTITUTIONAL: Alert and responds appropriately to questions. Well-appearing; well-nourished HEAD: Normocephalic, atraumatic EYES: Conjunctivae clear, pupils appear equal ENT: normal nose; moist mucous membranes NECK: Normal range of motion CARD: Regular rate and rhythm RESP: Normal chest excursion without splinting or tachypnea; no hypoxia or respiratory distress, speaking full sentences ABD/GI: non-distended EXT: Normal ROM in all joints, no major deformities noted, slightly tender over the proximal patella but has full range of motion of the knee including full extension.  No ligamentous laxity noted.  Mild soft tissue swelling over the patella but no redness or increased warmth.  Compartments are soft.  No joint effusion.  2+ right DP  pulse.  Ambulates with normal gait. SKIN: Normal color for age and race, no rashes on exposed skin NEURO: Moves all extremities equally, normal speech, no facial asymmetry noted PSYCH: The patient's mood and manner are appropriate. Grooming and personal hygiene are appropriate.  ED Results / Procedures / Treatments   LABS: (all labs ordered are listed, but only abnormal results are displayed) Labs Reviewed - No data to display   EKG:    RADIOLOGY: My personal review and interpretation of imaging: X-ray of the right knee shows no acute abnormality.  I have personally reviewed all radiology reports. DG Knee 2 Views Right  Result Date: 11/10/2021 CLINICAL DATA:  Right knee pain EXAM: RIGHT KNEE - 1-2 VIEW COMPARISON:  09/28/2021 FINDINGS: No evidence of fracture, dislocation, or joint effusion. No evidence of arthropathy or other  focal bone abnormality. Soft tissues are unremarkable. IMPRESSION: Negative. Electronically Signed   By: Fidela Salisbury M.D.   On: 11/10/2021 00:49     PROCEDURES:  Critical Care performed: No    Procedures    IMPRESSION / MDM / ASSESSMENT AND PLAN / ED COURSE  I reviewed the triage vital signs and the nursing notes.   Patient here with knee injury that has been ongoing for about 2 months.  No new injury.  Neurovascular intact distally.     DIFFERENTIAL DIAGNOSIS (includes but not limited to):   Knee sprain, doubt ACL, MCL, PCL tear.  No signs of gout or septic arthritis.  No signs of compartment syndrome, DVT, arterial obstruction.  Doubt fracture.  Patient's presentation is most consistent with acute complicated illness / injury requiring diagnostic workup.  PLAN: X-rays obtained from triage and reviewed and interpreted by myself and the radiologist and shows no acute abnormality.  Patient already has a knee sleeve and prescription for anti-inflammatories.  Will discharge with short course of analgesia with narcotic pain medication and provided with instructions for rest, elevation, ice.  Will provide with work note and orthopedic follow-up.   MEDICATIONS GIVEN IN ED: Medications  ibuprofen (ADVIL) tablet 800 mg (800 mg Oral Given 11/10/21 0203)  HYDROcodone-acetaminophen (NORCO/VICODIN) 5-325 MG per tablet 1 tablet (1 tablet Oral Given 11/10/21 0203)  ondansetron (ZOFRAN-ODT) disintegrating tablet 4 mg (4 mg Oral Given 11/10/21 0202)     ED COURSE:  At this time, I do not feel there is any life-threatening condition present. I reviewed all nursing notes, vitals, pertinent previous records.  All lab and urine results, EKGs, imaging ordered have been independently reviewed and interpreted by myself.  I reviewed all available radiology reports from any imaging ordered this visit.  Based on my assessment, I feel the patient is safe to be discharged home without further emergent  workup and can continue workup as an outpatient as needed. Discussed all findings, treatment plan as well as usual and customary return precautions.  They verbalize understanding and are comfortable with this plan.  Outpatient follow-up has been provided as needed.  All questions have been answered.    CONSULTS: No emergent orthopedic consult needed at this time.  Patient is stable for outpatient management.   OUTSIDE RECORDS REVIEWED: No previous records for review.     FINAL CLINICAL IMPRESSION(S) / ED DIAGNOSES   Final diagnoses:  Acute pain of right knee     Rx / DC Orders   ED Discharge Orders          Ordered    HYDROcodone-acetaminophen (NORCO/VICODIN) 5-325 MG tablet  Every 6 hours PRN  11/10/21 0127    ondansetron (ZOFRAN-ODT) 4 MG disintegrating tablet  Every 6 hours PRN        11/10/21 0127             Note:  This document was prepared using Dragon voice recognition software and may include unintentional dictation errors.   Izzac Rockett, Layla Maw, DO 11/10/21 1250

## 2021-11-10 NOTE — ED Triage Notes (Signed)
R knee pain. Reports hx of sprain and states pain has increased with muscle spasms. Pt ambulatory in triage.

## 2021-11-24 ENCOUNTER — Encounter: Payer: Self-pay | Admitting: Emergency Medicine

## 2021-11-24 ENCOUNTER — Emergency Department: Payer: Medicaid Other

## 2021-11-24 ENCOUNTER — Other Ambulatory Visit: Payer: Self-pay

## 2021-11-24 ENCOUNTER — Emergency Department
Admission: EM | Admit: 2021-11-24 | Discharge: 2021-11-24 | Disposition: A | Discharge status: Home / Self Care | Payer: Medicaid Other | Attending: Emergency Medicine | Admitting: Emergency Medicine

## 2021-11-24 DIAGNOSIS — X501XXA Overexertion from prolonged static or awkward postures, initial encounter: Secondary | ICD-10-CM | POA: Insufficient documentation

## 2021-11-24 DIAGNOSIS — Y9367 Activity, basketball: Secondary | ICD-10-CM | POA: Insufficient documentation

## 2021-11-24 DIAGNOSIS — S92101A Unspecified fracture of right talus, initial encounter for closed fracture: Secondary | ICD-10-CM | POA: Insufficient documentation

## 2021-11-24 MED ORDER — MELOXICAM 15 MG PO TABS: 15.0000 mg | ORAL_TABLET | Freq: Every day | ORAL | 2 refills | Status: AC

## 2021-11-24 MED ORDER — HYDROCODONE-ACETAMINOPHEN 5-325 MG PO TABS: 1.0000 | ORAL_TABLET | Freq: Four times a day (QID) | ORAL | 0 refills | Status: DC | PRN

## 2021-11-24 MED ORDER — ACETAMINOPHEN 325 MG PO TABS
650.0000 mg | ORAL_TABLET | Freq: Once | ORAL | Status: AC
  Administered 2021-11-24: 650 mg via ORAL
  Filled 2021-11-24: qty 2

## 2021-11-24 NOTE — ED Provider Notes (Signed)
[facility] Provider Note    Event Date/Time   First MD Initiated Contact with Patient 11/24/21 0732     (approximate)   History   Ankle Pain (Pt. To ED via POV from home for right ankle pain r/t twisting it while playing basketball last night. No pain meds today.)   HPI  Evan Leblanc. is a 31 y.o. male [facility] emergency department, no significant past medical history, complaining of right ankle pain.  States he was playing basketball last night came down on another person's foot and twisted his ankle.  States been painful overnight.  Hurts to bear weight.  No numbness or tingling.  No loss of motion.      Physical Exam   Triage Vital Signs: ED Triage Vitals  Enc Vitals Group     BP 11/24/21 0712 110/84     Pulse Rate 11/24/21 0712 67     Resp 11/24/21 0712 18     Temp 11/24/21 0712 98.6 F (37 C)     Temp Source 11/24/21 0712 Oral     SpO2 11/24/21 0712 97 %     Weight 11/24/21 0714 165 lb (74.8 kg)     Height 11/24/21 0714 6\' 1"  (1.854 m)     Head Circumference --      Peak Flow --      Pain Score 11/24/21 0713 10     Pain Loc --      Pain Edu? --      Excl. in [place]? --     Most recent vital signs: Vitals:   11/24/21 0712  BP: 110/84  Pulse: 67  Resp: 18  Temp: 98.6 F (37 C)  SpO2: 97%     General: Awake, no distress.   CV:  Good peripheral perfusion. regular rate and  rhythm Resp:  Normal effort.  Abd:  No distention.   Other:  Right ankle is tender at the medial malleolus, across the top of the foot proximally, neurovascular is intact, full range of motion noted   ED Results / Procedures / Treatments   Labs (all labs ordered are listed, but only abnormal results are displayed) Labs Reviewed - No data to display   EKG     RADIOLOGY X-ray of the right ankle    PROCEDURES:   Procedures   MEDICATIONS ORDERED IN ED: Medications  acetaminophen (TYLENOL) tablet 650 mg (650 mg Oral Given 11/24/21 0818)      IMPRESSION / MDM / [place] / ED COURSE  I reviewed the triage vital signs and the nursing notes.                              Differential diagnosis includes, but is not limited to, sprain, contusion, fracture  Patient's presentation is most consistent with acute complicated illness / injury requiring diagnostic workup.   X-ray of the right ankle  X-ray of the right ankle independently reviewed and interpreted by me as having an avulsion fracture of the talar neck.  Did explain these findings to the patient.  He is placed in a cam walker, given crutches.  He is to follow-up with podiatry.  He was given pain medication and anti-inflammatory.  He is to elevate and ice is much as possible.  Return emergency department if worsening.  He was discharged stable condition and is in agreement treatment plan.     FINAL CLINICAL IMPRESSION(S) / ED  DIAGNOSES   Final diagnoses:  Closed displaced fracture of right talus, unspecified fracture morphology, initial encounter     Rx / DC Orders   ED Discharge Orders          Ordered    HYDROcodone-acetaminophen (NORCO/VICODIN) 5-325 MG tablet  Every 6 hours PRN        11/24/21 0817    meloxicam (MOBIC) 15 MG tablet  Daily        11/24/21 0817             Note:  This document was prepared using Dragon voice recognition software and may include unintentional dictation errors.    Faythe Ghee, PA-C 11/24/21 Evan Leblanc    Shaune Pollack, MD 11/25/21 [phone]

## 2021-11-24 NOTE — Discharge Instructions (Signed)
Follow-up with [facility].  This will take about 6 weeks to heal.  Return emergency department worsening.  Do not bear weight until he can bear weight without pain.  Keep it elevated and iced is much as possible.

## 2021-11-24 NOTE — ED Triage Notes (Signed)
Pt. To ED via POV from home for right ankle pain r/t twisting it while playing basketball last night. No pain meds today.
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
## 2021-11-30 ENCOUNTER — Ambulatory Visit: Payer: Self-pay | Admitting: Podiatry

## 2022-01-01 ENCOUNTER — Ambulatory Visit (LOCAL_COMMUNITY_HEALTH_CENTER): Payer: Medicaid Other

## 2022-01-01 ENCOUNTER — Other Ambulatory Visit: Payer: Medicaid Other

## 2022-01-01 DIAGNOSIS — Z111 Encounter for screening for respiratory tuberculosis: Secondary | ICD-10-CM

## 2022-01-01 NOTE — Progress Notes (Signed)
Here for PPD as needed for work. 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 Pt has form. Jerel Shepherd, RN

## 2022-01-04 ENCOUNTER — Telehealth: Payer: Self-pay

## 2022-01-04 ENCOUNTER — Other Ambulatory Visit: Payer: Medicaid Other

## 2022-01-04 NOTE — Telephone Encounter (Signed)
TC to pt.  Missed PPDR appt today stating he had to do something with his son.  Informed PPD needs to be read at 72 hours and he will need to repeat skin test.  Pt will make appt.    Cherlynn Polo, RN

## 2022-03-09 ENCOUNTER — Emergency Department: Payer: Medicaid Other

## 2022-03-09 ENCOUNTER — Emergency Department
Admission: EM | Admit: 2022-03-09 | Discharge: 2022-03-09 | Disposition: A | Discharge status: Home / Self Care | Payer: Medicaid Other | Attending: Emergency Medicine | Admitting: Emergency Medicine

## 2022-03-09 ENCOUNTER — Other Ambulatory Visit: Payer: Self-pay

## 2022-03-09 DIAGNOSIS — S7002XA Contusion of left hip, initial encounter: Secondary | ICD-10-CM | POA: Insufficient documentation

## 2022-03-09 DIAGNOSIS — S79912A Unspecified injury of left hip, initial encounter: Secondary | ICD-10-CM | POA: Diagnosis present

## 2022-03-09 DIAGNOSIS — S7012XA Contusion of left thigh, initial encounter: Secondary | ICD-10-CM | POA: Diagnosis not present

## 2022-03-09 MED ORDER — ACETAMINOPHEN 325 MG PO TABS
650.0000 mg | ORAL_TABLET | Freq: Once | ORAL | Status: AC
  Administered 2022-03-09: 650 mg via ORAL
  Filled 2022-03-09: qty 2

## 2022-03-09 NOTE — Discharge Instructions (Signed)
Your x-ray was normal.  Please continue to rest, ice the area, and take Tylenol/ibuprofen per package instructions as needed for pain.  Please return for any new, worsening, or change in symptoms or other concerns.  It was a pleasure caring for you today.
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
## 2022-03-09 NOTE — ED Notes (Addendum)
Pt ambulatory to the front desk to inform this RN he was stepping outside to smoke. Advised to wait until being triaged. Pt seen leaving the lobby.

## 2022-04-06 ENCOUNTER — Other Ambulatory Visit: Payer: Self-pay

## 2022-04-06 DIAGNOSIS — S0990XA Unspecified injury of head, initial encounter: Secondary | ICD-10-CM | POA: Diagnosis present

## 2022-04-06 DIAGNOSIS — W01198A Fall on same level from slipping, tripping and stumbling with subsequent striking against other object, initial encounter: Secondary | ICD-10-CM | POA: Diagnosis not present

## 2022-04-06 DIAGNOSIS — S0083XA Contusion of other part of head, initial encounter: Secondary | ICD-10-CM | POA: Insufficient documentation

## 2022-04-06 NOTE — ED Triage Notes (Signed)
Pt presents to ER from work with c/o head injury while at work.  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  Pt states can fell off shelf and hit pt in right temple area.  Pt does have small hematoma noted to right frontal area of head.  Pt denies LOC, but is having pain at this time.  Pt ambulatory on arrival.  NAD noted at this time.

## 2022-04-07 ENCOUNTER — Emergency Department
Admission: EM | Admit: 2022-04-07 | Discharge: 2022-04-07 | Disposition: A | Discharge status: Home / Self Care | Payer: Medicaid Other | Attending: Emergency Medicine | Admitting: Emergency Medicine

## 2022-04-07 DIAGNOSIS — S0083XA Contusion of other part of head, initial encounter: Secondary | ICD-10-CM

## 2022-04-07 DIAGNOSIS — S0990XA Unspecified injury of head, initial encounter: Secondary | ICD-10-CM

## 2022-04-07 MED ORDER — ACETAMINOPHEN 500 MG PO TABS
1000.0000 mg | ORAL_TABLET | Freq: Once | ORAL | Status: AC
  Administered 2022-04-07: 1000 mg via ORAL
  Filled 2022-04-07: qty 2

## 2022-04-07 NOTE — ED Provider Notes (Signed)
[facility] Provider Note    Event Date/Time   First MD Initiated Contact with Patient 04/07/22 0031     (approximate)   History   Head Injury   HPI  Evan Leblanc. is a 32 y.o. male who reports no chronic medical issues and presents for evaluation of head injury.  He reports that he was working at [employer] and bent down to pick up a can that was on the floor when another can of food (according to him, normal size can) fell and hit him on the right side of his forehead.  He did not lose consciousness.  He continues to have pain in the area.  He has a little bit of bruising and swelling.  No neck injury nor neck pain.  No visual changes.  No nausea nor vomiting.  No word finding difficulties.  No numbness or weakness in his extremities.  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     Physical Exam   Triage Vital Signs: ED Triage Vitals  Enc Vitals Group     BP 04/06/22 2346 118/80     Pulse Rate 04/06/22 2346 (!) 54     Resp 04/06/22 2346 16     Temp 04/06/22 2346 98.4 F (36.9 C)     Temp Source 04/06/22 2346 Oral     SpO2 04/06/22 2346 97 %     Weight 04/06/22 2347 74.8 kg (165 lb)     Height 04/06/22 2347 1.854 m ('6\' 1"'$ )     Head Circumference --      Peak Flow --      Pain Score 04/06/22 2346 8     Pain Loc --      Pain Edu? --      Excl. in [place]? --     Most recent vital signs: Vitals:   04/06/22 2346  BP: 118/80  Pulse: (!) 54  Resp: 16  Temp: 98.4 F (36.9 C)  SpO2: 97%     General: Awake, no distress.  Generally well-appearing.  Resting but awakens easily during the interview. Head:  Patient has a contusion with a small amount of swelling on the far right of his forehead.  Minimal tenderness to palpation.  No laceration.  No other injuries appreciated on his head.  He has no tenderness to palpation of his cervical spine and has normal  flexion, extension, and rotation side-to-side of his head and neck. CV:  Good peripheral perfusion.  Resp:  Normal effort. Speaking easily and comfortably, no accessory muscle usage nor intercostal retractions.   Abd:  No distention.    ED Results / Procedures / Treatments   Labs (all labs ordered are listed, but only abnormal results are displayed) Labs Reviewed - No data to display      PROCEDURES:  Critical Care performed: No  Procedures   MEDICATIONS ORDERED IN ED: Medications  acetaminophen (TYLENOL) tablet 1,000 mg (has no administration in time range)     IMPRESSION / MDM / ASSESSMENT AND PLAN / ED COURSE  I reviewed the triage vital signs and the nursing notes.                              Differential diagnosis includes, but is not limited to, contusion, skull fracture, intracranial bleed, neck injury.  Patient's presentation is most consistent with acute, uncomplicated illness.   Patient has a mild contusion to his forehead.  I explained to him that it is very unlikely he sustained a serious injury given the location and relatively minimal mechanism of injury/impact.  No indication for CT scan based on [place] head CT rules and no indication for cervical spine CT based on Nexus criteria.  I provided reassurance, ordered acetaminophen 1000 mg p.o., and encouraged the use of over-the-counter analgesia at home according to label instructions.  He understands and agrees.  I asked multiple times, but each time he declined Worker's Compensation paperwork.      FINAL CLINICAL IMPRESSION(S) / ED DIAGNOSES   Final diagnoses:  Minor head injury, initial encounter  Forehead contusion, initial encounter     Rx / DC Orders   ED Discharge Orders     None        Note:  This document was prepared using Dragon voice recognition software and may include unintentional dictation errors.   Hinda Kehr, MD 04/07/22 [phone]

## 2022-04-08 ENCOUNTER — Encounter: Payer: Self-pay | Admitting: Emergency Medicine

## 2022-04-08 ENCOUNTER — Emergency Department
Admission: EM | Admit: 2022-04-08 | Discharge: 2022-04-08 | Disposition: A | Discharge status: Home / Self Care | Payer: Medicaid Other | Attending: Emergency Medicine | Admitting: Emergency Medicine

## 2022-04-08 ENCOUNTER — Other Ambulatory Visit: Payer: Self-pay

## 2022-04-08 DIAGNOSIS — F4320 Adjustment disorder, unspecified: Secondary | ICD-10-CM | POA: Insufficient documentation

## 2022-04-08 DIAGNOSIS — R4689 Other symptoms and signs involving appearance and behavior: Secondary | ICD-10-CM

## 2022-04-08 DIAGNOSIS — F32A Depression, unspecified: Secondary | ICD-10-CM | POA: Insufficient documentation

## 2022-04-08 LAB — COMPREHENSIVE METABOLIC PANEL
ALT: 23 U/L (ref 0–44)
AST: 21 U/L (ref 15–41)
Albumin: 4.1 g/dL (ref 3.5–5.0)
Alkaline Phosphatase: 81 U/L (ref 38–126)
Anion gap: 5 (ref 5–15)
BUN: 16 mg/dL (ref 6–20)
CO2: 25 mmol/L (ref 22–32)
Calcium: 9.2 mg/dL (ref 8.9–10.3)
Chloride: 109 mmol/L (ref 98–111)
Creatinine, Ser: 1.06 mg/dL (ref 0.61–1.24)
GFR, Estimated: >60 mL/min (ref >60)
Glucose, Bld: 110 mg/dL — ABNORMAL HIGH (ref 70–99)
Potassium: 4.1 mmol/L (ref 3.5–5.1)
Sodium: 139 mmol/L (ref 135–145)
Total Bilirubin: 1.3 mg/dL — ABNORMAL HIGH (ref 0.3–1.2)
Total Protein: 6.6 g/dL (ref 6.5–8.1)

## 2022-04-08 LAB — CBC WITH DIFFERENTIAL/PLATELET
Abs Immature Granulocytes: 0.01 10*3/uL (ref 0.00–0.07)
Basophils Absolute: 0 10*3/uL (ref 0.0–0.1)
Basophils Relative: 1 %
Eosinophils Absolute: 0.3 10*3/uL (ref 0.0–0.5)
Eosinophils Relative: 7 %
HCT: 38.9 % — ABNORMAL LOW (ref 39.0–52.0)
Hemoglobin: 12.5 g/dL — ABNORMAL LOW (ref 13.0–17.0)
Immature Granulocytes: 0 %
Lymphocytes Relative: 44 %
Lymphs Abs: 2.1 10*3/uL (ref 0.7–4.0)
MCH: 27.3 pg (ref 26.0–34.0)
MCHC: 32.1 g/dL (ref 30.0–36.0)
MCV: 84.9 fL (ref 80.0–100.0)
Monocytes Absolute: 0.5 10*3/uL (ref 0.1–1.0)
Monocytes Relative: 9 %
Neutro Abs: 1.9 10*3/uL (ref 1.7–7.7)
Neutrophils Relative %: 39 %
Platelets: 247 10*3/uL (ref 150–400)
RBC: 4.58 MIL/uL (ref 4.22–5.81)
RDW: 13.1 % (ref 11.5–15.5)
WBC: 4.9 10*3/uL (ref 4.0–10.5)
nRBC: 0 % (ref 0.0–0.2)

## 2022-04-08 LAB — ETHANOL: Alcohol, Ethyl (B): <10 mg/dL

## 2022-04-08 NOTE — BH Assessment (Addendum)
Comprehensive Clinical Assessment (CCA) Note  04/08/2022 Linda Hedges. [id number]  Tamala Bari. presents to [facility] ED after having an argument with his wife. He shared that she became concern with patient's attitude and suggested that he come to the ED. Patient adamantly denies SI/HI/AVH to this Probation officer. He also denied past suicide attempts and/or thoughts. He shared that he and his wife are currently homeless and living in their car. Patient reports having sxs of depression where he often isolates and "shuts down". He is currently employed part-time at the [employer] in [place]. Patient has 1 son Manufacturing systems engineer) who is currently living with the patient's adult cousin until pt can secure stable housing. DSS is also currently involved with patient due to a situation in [place] with child's mother. After custody was removed from the child's mother (in [place]), pt relocated his son here in [place] to be with him.  Patient would benefit from individual and couple's counseling to better manage stressors and improve conflict resolution. Patient appeared to be open to the idea of seeking therapy treatment. Patient is not prescribed any psychotropic medications nor is he receiving any mental health treatment. Patient reports having an upcoming appointment with his PCP to discuss medications on 04/28/2022.   Patient was alert and oriented x4 while pleasant in mood and coherent in his thought process. Patient did not appear to be responding to internal stimuli while speaking with this Probation officer. Pt denied alcohol/substance use - UDS was never collected during ED visit. No court dates pending in [place] court database.  TTS attached a list of outpatient therapy resources to pt's AVS, along with resources for housing.  Visit Diagnosis:  Unspecified Depressive Disorder Adjustment Disorder    CCA Screening, Triage and Referral (STR)  Patient Reported Information How did you hear about [place]?  Family/Friend  Referral name: No data recorded Referral phone number: No data recorded  Whom do you see for routine medical problems? No data recorded Practice/Facility Name: No data recorded Practice/Facility Phone Number: No data recorded Name of Contact: No data recorded Contact Number: No data recorded Contact Fax Number: No data recorded Prescriber Name: No data recorded Prescriber Address (if known): No data recorded  What Is the Reason for Your Visit/Call Today? "Me and my wife got into an argument and she said she wanted me to come here because of my attitude. But it never got to the point where I wanted to hurt her."  How Long Has This Been Causing You Problems? 1-6 months  What Do You Feel Would Help You the Most Today? Housing Assistance; Stress Management   Have You Recently Been in Any Inpatient Treatment (Hospital/Detox/Crisis Center/28-Day Program)? No data recorded Name/Location of Program/Hospital:No data recorded How Long Were You There? No data recorded When Were You Discharged? No data recorded  Have You Ever Received Services From [facility] Before? No data recorded Who Do You See at [facility]? No data recorded  Have You Recently Had Any Thoughts About Hurting Yourself? No  Are You Planning to Commit Suicide/Harm Yourself At This time? No   Have you Recently Had Thoughts About [place]? No  Explanation: No data recorded  Have You Used Any Alcohol or Drugs in the Past 24 Hours? No  How Long Ago Did You Use Drugs or Alcohol? No data recorded What Did You Use and How Much? No data recorded  Do You Currently Have a Therapist/Psychiatrist? No  Name of Therapist/Psychiatrist: No data recorded  Have You Been Recently Discharged From Any Office Practice or Programs? No  Explanation of Discharge From Practice/Program: No data recorded    CCA Screening Triage Referral Assessment Type of Contact: Face-to-Face  Is this Initial or  Reassessment? No data recorded Date Telepsych consult ordered in [facility]:  No data recorded Time Telepsych consult ordered in [facility]:  No data recorded  Patient Reported Information Reviewed? No data recorded Patient Left Without Being Seen? No data recorded Reason for Not Completing Assessment: No data recorded  Collateral Involvement: No data recorded  Does Patient Have a [place]? No data recorded Name and Contact of Legal Guardian: No data recorded If Minor and Not Living with Parent(s), Who has Custody? No data recorded Is CPS involved or ever been involved? Never  Is APS involved or ever been involved? Never   Patient Determined To Be At Risk for Harm To Self or Others Based on Review of Patient Reported Information or Presenting Complaint? No  Method: No data recorded Availability of Means: No data recorded Intent: No data recorded Notification Required: No data recorded Additional Information for Danger to Others Potential: No data recorded Additional Comments for Danger to Others Potential: No data recorded Are There Guns or Other Weapons in Your Home? No data recorded Types of Guns/Weapons: No data recorded Are These Weapons Safely Secured?                            No data recorded Who Could Verify You Are Able To Have These Secured: No data recorded Do You Have any Outstanding Charges, Pending Court Dates, Parole/Probation? No data recorded Contacted To Inform of Risk of Harm To Self or Others: No data recorded  Location of Assessment: [facility] ED   Does Patient Present under Involuntary Commitment? No  IVC Papers Initial File Date: No data recorded  [place] of Residence: [place]   Patient Currently Receiving the Following Services: Not Receiving Services   Determination of Need: Routine (7 days)   Options For Referral: Therapeutic Triage Services     CCA Biopsychosocial Intake/Chief Complaint:  No data recorded Current  Symptoms/Problems: No data recorded  Patient Reported Schizophrenia/Schizoaffective Diagnosis in Past: No data recorded  Strengths: No data recorded Preferences: No data recorded Abilities: No data recorded  Type of Services Patient Feels are Needed: No data recorded  Initial Clinical Notes/Concerns: No data recorded  Mental Health Symptoms Depression:   Hopelessness; Fatigue; Change in energy/activity; Irritability; Worthlessness   Duration of Depressive symptoms:  Greater than two weeks   Mania:   None   Anxiety:    Worrying   Psychosis:   None   Duration of Psychotic symptoms: No data recorded  Trauma:   None   Obsessions:   None   Compulsions:   None   Inattention:   None   Hyperactivity/Impulsivity:   None   Oppositional/Defiant Behaviors:   None   Emotional Irregularity:   None   Other Mood/Personality Symptoms:   None Reported    Mental Status Exam Appearance and self-care  Stature:   Tall   Weight:   Average weight   Clothing:   -- (Hospital scrubs)   Grooming:   Neglected   Cosmetic use:   None   Posture/gait:   Other (Comment) (Laying down in bed)   Motor activity:   Not Remarkable   Sensorium  Attention:   Normal   Concentration:   Normal   Orientation:  X5   Recall/memory:   Normal   Affect and Mood  Affect:   Appropriate   Mood:   Euthymic   Relating  Eye contact:   Normal   Facial expression:   Responsive   Attitude toward examiner:   Cooperative   Thought and Language  Speech flow:  Clear and Coherent   Thought content:   Appropriate to Mood and Circumstances   Preoccupation:   None   Hallucinations:   None   Organization:  No data recorded  [employer] of Knowledge:   Average   Intelligence:   Average   Abstraction:   Normal   Judgement:   Good   Reality Testing:   Realistic   Insight:   Good   Decision Making:   Impulsive   Social Functioning   Social Maturity:   Impulsive   Social Judgement:   "Street Smart"   Stress  Stressors:   Family conflict; Financial; Housing; Relationship   Coping Ability:   Exhausted; Deficient supports   Skill Deficits:   Communication; Self-control   Supports:   Family     Religion: Religion/Spirituality Are You A Religious Person?: No  Leisure/Recreation: Leisure / Recreation Do You Have Hobbies?: No  Exercise/Diet: Exercise/Diet Do You Exercise?: No Have You Gained or Lost A Significant Amount of Weight in the Past Six Months?: No Do You Follow a Special Diet?: No Do You Have Any Trouble Sleeping?: Yes Explanation of Sleeping Difficulties: Patient living in a car.   CCA Employment/Education Employment/Work Situation: Employment / Work Situation Employment Situation: Employed Work Stressors: Patient works part-time at the [employer]. Patient's Job has Been Impacted by Current Illness: Yes Describe how Patient's Job has Been Impacted: Housing situation impacts his job Has Patient ever Been in Passenger transport manager?: No  Education: Education Is Patient Currently Attending School?: No   CCA Family/Childhood History Family and Relationship History: Family history Marital status: Married What types of issues is patient dealing with in the relationship?: Poor commuinication, conflict, and arguing Additional relationship information: Lack of income and housing Does patient have children?: Yes How many children?: 1 How is patient's relationship with their children?: Patient reports having a good relationship with his son. His son is currently living with his cousin temporarily until he finds stable housing.     Child/Adolescent Assessment: N/A     CCA Substance Use Alcohol/Drug Use: Alcohol / Drug Use Pain Medications: See MAR Prescriptions: See MAR Over the Counter: See MAR History of alcohol / drug use?: No history of alcohol / drug abuse (No UDS collected while  in ED)     ASAM's:  Six Dimensions of Multidimensional Assessment  Dimension 1:  Acute Intoxication and/or Withdrawal Potential:      Dimension 2:  Biomedical Conditions and Complications:      Dimension 3:  Emotional, Behavioral, or Cognitive Conditions and Complications:     Dimension 4:  Readiness to Change:     Dimension 5:  Relapse, Continued use, or Continued Problem Potential:     Dimension 6:  Recovery/Living Environment:     ASAM Severity Score:    ASAM Recommended Level of Treatment:     Substance use Disorder (SUD)    Recommendations for Services/Supports/Treatments:    DSM5 Diagnoses: There are no problems to display for this patient.   Frederich Cha, LCSW, [place], Counselor Therapeutic Triage Specialist

## 2022-04-08 NOTE — TOC Initial Note (Signed)
Transition of Care (TOC) - Initial/Assessment Note    Patient Details  Name: Evan Leblanc. MRN: [id number] Date of Birth: 1990/08/14  Transition of Care [facility]) CM/SW Contact:    Loreta Ave, [place] Phone Number: 04/08/2022, 8:39 AM  Clinical Narrative:                  CSW received consult, placed shelter resource on AVS for pt. TOC will continue to follow for any additional needs that may arise.        Patient Goals and CMS Choice            Expected Discharge Plan and Services                                              Prior Living Arrangements/Services                       Activities of Daily Living      Permission Sought/Granted                  Emotional Assessment              Admission diagnosis:  Behav Med Eval There are no problems to display for this patient.  PCP:  Center, [place]:   [facility] [address] [place] [address] [place]) [place] [address] Phone: [phone] Fax: [phone]     Social Determinants of Health (SDOH) Social History: SDOH Screenings   Tobacco Use: High Risk (04/08/2022)   SDOH Interventions:     Readmission Risk Interventions     No data to display
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
## 2022-04-08 NOTE — ED Notes (Signed)
Aware of need for urine sample, urinal at bedside 

## 2022-04-08 NOTE — ED Triage Notes (Addendum)
Presents from home with fiance for c/o increased trouble controlling his anger. No report of physical violence. Pt denies HI/SI or A/V hallucinations. Not currently taking any medications.Denies etOH or drug use. NAD, A&Ox4, pleasant and clearly expresses himself during interview.

## 2022-04-08 NOTE — ED Notes (Signed)
TTS at bedside. 

## 2022-04-08 NOTE — ED Notes (Signed)
Belongings removed from room and placed in belonging bags. 2 total bags - jeans, jacket, shirt, underwear, socks, boots, bracelets.

## 2022-04-08 NOTE — Discharge Instructions (Signed)
Please contact the provider below for outpatient individual, family, and/or couple's counseling services:  [employer] [employer] [employer] (previously [facility]) [address] [place], [place] [address] Phone: [phone]      For housing resources please contact the following:  [employer] [address] [place], [place]     Search online for affordable housing:  [email or web address]

## 2022-04-08 NOTE — ED Notes (Signed)
Pt given belongings and work note

## 2022-04-08 NOTE — ED Notes (Signed)
Beverage provided and pt reminded of need for urine

## 2022-04-09 ENCOUNTER — Emergency Department: Payer: Medicaid Other

## 2022-04-09 ENCOUNTER — Encounter: Payer: Self-pay | Admitting: Emergency Medicine

## 2022-04-09 ENCOUNTER — Other Ambulatory Visit: Payer: Self-pay

## 2022-04-09 ENCOUNTER — Emergency Department
Admission: EM | Admit: 2022-04-09 | Discharge: 2022-04-09 | Disposition: A | Discharge status: Home / Self Care | Payer: Medicaid Other | Attending: Student in an Organized Health Care Education/Training Program | Admitting: Student in an Organized Health Care Education/Training Program

## 2022-04-09 DIAGNOSIS — R42 Dizziness and giddiness: Secondary | ICD-10-CM | POA: Diagnosis not present

## 2022-04-09 DIAGNOSIS — R519 Headache, unspecified: Secondary | ICD-10-CM

## 2022-04-09 DIAGNOSIS — J45909 Unspecified asthma, uncomplicated: Secondary | ICD-10-CM | POA: Diagnosis not present

## 2022-04-09 NOTE — ED Provider Notes (Signed)
Formatting of this note is different from the original.  Images from the original note were not included.      [facility]  Provider Note     Event Date/Time    First MD Initiated Contact with Patient 04/09/22 1107      (approximate)    History     Headache    HPI    Grant Rush. is a 32 y.o. male with history of asthma presents emergency department complaining of worsening headache.  Patient was at work a few days ago when a 1 pound can fell on the right side of his head near his temple.  Patient awoke today with severe headache and had vomiting secondary to the headache.  States pain is gotten a little worse.  Some dizziness associated with the headache.  Has not taken Tylenol or ibuprofen.  States he just wants to know what is wrong.  Denies neck pain, numbness or tingling or disorientation.        Physical Exam     Triage Vital Signs:  ED Triage Vitals   Enc Vitals Group      BP 04/09/22 1029 121/81      Pulse Rate 04/09/22 1029 75      Resp 04/09/22 1029 16      Temp 04/09/22 1029 98.7 F (37.1 C)      Temp Source 04/09/22 1029 Oral      SpO2 04/09/22 1029 100 %      Weight 04/09/22 1029 165 lb (74.8 kg)      Height 04/09/22 1029  (1.854 m)      Head Circumference --       Peak Flow --       Pain Score 04/09/22 1037 10      Pain Loc --       Pain Edu? --       Excl. in [place]? --      Most recent vital signs:  Vitals:    04/09/22 1029   BP: 121/81   Pulse: 75   Resp: 16   Temp: 98.7 F (37.1 C)   SpO2: 100%     General: Awake, no distress.    CV:  Good peripheral perfusion. regular rate and  rhythm  Resp:  Normal effort. Lungs cta  Abd:  No distention.    Other:  Skull is tender on the right side near the temple, swelling is noted, neck is supple, no lymphadenopathy, no C-spine tenderness, cranial nerves II through XII grossly intact    ED Results / Procedures / Treatments     Labs  (all labs ordered are listed, but only abnormal results are displayed)  Labs Reviewed - No data to  display    EKG    RADIOLOGY  CT of the head    PROCEDURES:    Procedures    MEDICATIONS ORDERED IN ED:  Medications - No data to display    IMPRESSION / MDM / ASSESSMENT AND PLAN / ED COURSE   I reviewed the triage vital signs and the nursing notes.        Differential diagnosis includes, but is not limited to, contusion, subdural, SAH, bad headache, anxiety about health care    Patient's presentation is most consistent with acute presentation with potential threat to life or bodily function.     Due to the worsening headache along with an episode of vomiting due to pain we will go ahead and do  the CT of the head without contrast to evaluate for subdural, skull fracture etc.    CT of the head ordered     CT of the head, I did independently review and interpret the radiologist reading as being negative for any acute abnormality    I did explain this to the patient.  He is to follow-up with his regular doctor as needed.  Return emergency department worsening.  Take Tylenol for pain.  He is given a work note and discharged stable condition.    FINAL CLINICAL IMPRESSION(S) / ED DIAGNOSES     Final diagnoses:   Bad headache     Rx / DC Orders     ED Discharge Orders       None         Note:  This document was prepared using Dragon voice recognition software and may include unintentional dictation errors.      Faythe Ghee, PA-C  04/09/22 1250      Willy Eddy, MD  04/09/22 1258    Electronically signed by Willy Eddy, MD at 04/09/2022 12:58 PM EDT    Associated attestation - Willy Eddy, MD - 04/09/2022 12:58 PM EDT  Formatting of this note might be different from the original.  Attestation: Medical screening examination/treatment/procedure(s) were performed by non-physician practitioner and as supervising physician I was immediately available for consultation/collaboration.

## 2022-04-09 NOTE — ED Triage Notes (Signed)
Formatting of this note might be different from the original.  Pt here with a headache. Pt states he was at work when a canned item fell and hit him in the head. Pt states he was seen for the same recently and was told to come back for anything abnormal. Pt states he started throwing up light red blood about 10 mins ago. Pt states headache pain is more on the right side, has some dizziness.  Electronically signed by Cira Servant, RN at 04/09/2022 10:36 AM EDT
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
## 2022-04-09 NOTE — ED Provider Notes (Signed)
[facility] Provider Note    Event Date/Time   First MD Initiated Contact with Patient 04/09/22 1107     (approximate)   History   Headache   HPI  Evan Leblanc. is a 32 y.o. male with history of asthma presents emergency department complaining of worsening headache.  Patient was at work a few days ago when a 1 pound can fell on the right side of his head near his temple.  Patient awoke today with severe headache and had vomiting secondary to the headache.  States pain is gotten a little worse.  Some dizziness associated with the headache.  Has not taken Tylenol or ibuprofen.  States he just wants to know what is wrong.  Denies neck pain, numbness or tingling or disorientation.      Physical Exam   Triage Vital Signs: ED Triage Vitals  Enc Vitals Group     BP 04/09/22 1029 121/81     Pulse Rate 04/09/22 1029 75     Resp 04/09/22 1029 16     Temp 04/09/22 1029 98.7 F (37.1 C)     Temp Source 04/09/22 1029 Oral     SpO2 04/09/22 1029 100 %     Weight 04/09/22 1029 165 lb (74.8 kg)     Height 04/09/22 1029 '6\' 1"'$  (1.854 m)     Head Circumference --      Peak Flow --      Pain Score 04/09/22 1037 10     Pain Loc --      Pain Edu? --      Excl. in [place]? --     Most recent vital signs: Vitals:   04/09/22 1029  BP: 121/81  Pulse: 75  Resp: 16  Temp: 98.7 F (37.1 C)  SpO2: 100%     General: Awake, no distress.   CV:  Good peripheral perfusion. regular rate and  rhythm Resp:  Normal effort. Lungs cta Abd:  No distention.   Other:  Skull is tender on the right side near the temple, swelling is noted, neck is supple, no lymphadenopathy, no C-spine tenderness, cranial nerves II through XII grossly intact   ED Results / Procedures / Treatments   Labs (all labs ordered are listed, but only abnormal results are displayed) Labs Reviewed - No data to display   EKG     RADIOLOGY CT of the  head    PROCEDURES:   Procedures   MEDICATIONS ORDERED IN ED: Medications - No data to display   IMPRESSION / MDM / [place] / ED COURSE  I reviewed the triage vital signs and the nursing notes.                              Differential diagnosis includes, but is not limited to, contusion, subdural, SAH, bad headache, anxiety about health care  Patient's presentation is most consistent with acute presentation with potential threat to life or bodily function.   Due to the worsening headache along with an episode of vomiting due to pain we will go ahead and do the CT of the head without contrast to evaluate for subdural, skull fracture etc.  CT of the head ordered   CT of the head, I did independently review and interpret the radiologist reading as being negative for any acute abnormality  I did explain this to the patient.  He is to follow-up with his  regular doctor as needed.  Return emergency department worsening.  Take Tylenol for pain.  He is given a work note and discharged stable condition.   FINAL CLINICAL IMPRESSION(S) / ED DIAGNOSES   Final diagnoses:  Bad headache     Rx / DC Orders   ED Discharge Orders     None        Note:  This document was prepared using Dragon voice recognition software and may include unintentional dictation errors.    Versie Starks, PA-C 04/09/22 1250    Merlyn Lot, MD 04/09/22 1258

## 2022-04-09 NOTE — Discharge Instructions (Signed)
Take Tylenol for pain as needed.  Apply ice to the right side of your head.  Return emergency department worsening

## 2022-04-09 NOTE — ED Triage Notes (Signed)
Pt here with a headache. Pt states he was at work when a canned item fell and hit him in the head. Pt states he was seen for the same recently and was told to come back for anything abnormal. Pt states he started throwing up light red blood about 10 mins ago. Pt states headache pain is more on the right side, has some dizziness.

## 2022-04-09 NOTE — ED Notes (Signed)
See triage note. Patient came in due to a headache that started approximately three days ago due to getting hit in the head at work. Pt was here yesterday and stated he was told to come back to the ED if anything worsened. Pt started coughing up blood.

## 2022-05-21 ENCOUNTER — Inpatient Hospital Stay
Admit: 2022-05-21 | Discharge: 2022-05-21 | Disposition: A | Discharge status: Home / Self Care | Payer: PRIVATE HEALTH INSURANCE | Attending: Emergency Medicine

## 2022-05-21 ENCOUNTER — Emergency Department: Admit: 2022-05-21 | Payer: PRIVATE HEALTH INSURANCE

## 2022-05-21 DIAGNOSIS — J302 Other seasonal allergic rhinitis: Secondary | ICD-10-CM

## 2022-05-21 DIAGNOSIS — R0789 Other chest pain: Secondary | ICD-10-CM

## 2022-05-21 LAB — COMPREHENSIVE METABOLIC PANEL W/ REFLEX TO MG FOR LOW K
ALT: 32 U/L (ref 12–78)
AST: 16 U/L (ref 15–37)
Albumin/Globulin Ratio: 1.2 (ref 1.1–2.2)
Albumin: 3.7 g/dL (ref 3.5–5.0)
Alk Phosphatase: 82 U/L (ref 45–117)
Anion Gap: 7 mmol/L (ref 5–15)
BUN: 14 mg/dL (ref 6–20)
Bun/Cre Ratio: 12 (ref 12–20)
CO2: 30 mmol/L (ref 21–32)
Calcium: 8.9 mg/dL (ref 8.5–10.1)
Chloride: 101 mmol/L (ref 97–108)
Creatinine: 1.16 mg/dL (ref 0.70–1.30)
Est, Glom Filt Rate: 87 mL/min/{1.73_m2} (ref >60)
Globulin: 3.2 g/dL (ref 2.0–4.0)
Glucose: 79 mg/dL (ref 65–100)
Potassium: 4.2 mmol/L (ref 3.5–5.1)
Sodium: 138 mmol/L (ref 136–145)
Total Bilirubin: 1.3 mg/dL — ABNORMAL HIGH (ref 0.2–1.0)
Total Protein: 6.9 g/dL (ref 6.4–8.2)

## 2022-05-21 LAB — CBC WITH AUTO DIFFERENTIAL
Basophils %: 1 % (ref 0–1)
Basophils Absolute: 0.1 10*3/uL (ref 0.0–0.1)
Eosinophils %: 10 % — ABNORMAL HIGH (ref 0–7)
Eosinophils Absolute: 0.5 10*3/uL — ABNORMAL HIGH (ref 0.0–0.4)
Hematocrit: 40.3 % (ref 36.6–50.3)
Hemoglobin: 13.4 g/dL (ref 12.1–17.0)
Immature Granulocytes %: 0 % (ref 0–0.5)
Immature Granulocytes Absolute: 0 10*3/uL (ref 0.00–0.04)
Lymphocytes %: 48 % (ref 12–49)
Lymphocytes Absolute: 2.3 10*3/uL (ref 0.8–3.5)
MCH: 28.2 PG (ref 26.0–34.0)
MCHC: 33.3 g/dL (ref 30.0–36.5)
MCV: 84.7 FL (ref 80.0–99.0)
MPV: 9.9 FL (ref 8.9–12.9)
Monocytes %: 8 % (ref 5–13)
Monocytes Absolute: 0.4 10*3/uL (ref 0.0–1.0)
Neutrophils %: 33 % (ref 32–75)
Neutrophils Absolute: 1.6 10*3/uL — ABNORMAL LOW (ref 1.8–8.0)
Nucleated RBCs: 0 PER 100 WBC
Platelets: 254 10*3/uL (ref 150–400)
RBC: 4.76 M/uL (ref 4.10–5.70)
RDW: 13.4 % (ref 11.5–14.5)
WBC: 4.8 10*3/uL (ref 4.1–11.1)
nRBC: 0 10*3/uL (ref 0.00–0.01)

## 2022-05-21 LAB — TROPONIN
Troponin, High Sensitivity: 4 ng/L (ref 0–76)
Troponin, High Sensitivity: 4 ng/L (ref 0–76)

## 2022-05-21 MED ORDER — ALBUTEROL SULFATE HFA 108 (90 BASE) MCG/ACT IN AERS
108 | Freq: Four times a day (QID) | RESPIRATORY_TRACT | 0 refills | Status: DC | PRN
Start: 2022-05-21 — End: 2022-08-02

## 2022-05-21 MED ORDER — IPRATROPIUM-ALBUTEROL 0.5-2.5 (3) MG/3ML IN SOLN
RESPIRATORY_TRACT | Status: AC
Start: 2022-05-21 — End: 2022-05-21
  Administered 2022-05-21: 16:00:00 1 via RESPIRATORY_TRACT

## 2022-05-21 MED ORDER — PREDNISONE 20 MG PO TABS
20 | Freq: Every day | ORAL | Status: DC
Start: 2022-05-21 — End: 2022-05-21
  Administered 2022-05-21: 17:00:00 40 mg via ORAL

## 2022-05-21 MED ORDER — MONTELUKAST SODIUM 10 MG PO TABS
10 | ORAL_TABLET | Freq: Every evening | ORAL | 0 refills | Status: AC
Start: 2022-05-21 — End: ?

## 2022-05-21 MED FILL — PREDNISONE 20 MG PO TABS: 20 MG | ORAL | Qty: 2

## 2022-05-21 MED FILL — IPRATROPIUM-ALBUTEROL 0.5-2.5 (3) MG/3ML IN SOLN: RESPIRATORY_TRACT | Qty: 3

## 2022-05-21 NOTE — ED Triage Notes (Signed)
Pt presents with "allergies" and chest pain with cough. Sinus brady 50

## 2022-05-21 NOTE — ED Provider Notes (Signed)
[facility] EMERGENCY DEPT  EMERGENCY DEPARTMENT HISTORY AND PHYSICAL EXAM      Date: 05/21/2022  Patient Name: Grant Rush  MRN: [id number]  Birthdate: December 29, 1990  Date of evaluation: 05/21/2022  Provider: Marquette Old, MD   Note Started: 10:51 AM EDT 05/21/22    HISTORY OF PRESENT ILLNESS     Chief Complaint   Patient presents with    Allergies    Chest Pain       History Provided By: Patient    HPI: Grant Rush is a 32 y.o. male with asthma (remotely as a child age 75) presents with week of nasal congestion and runny nose with chest tightness and wheezing. He does report trouble when seasons change.    PAST MEDICAL HISTORY   Past Medical History:  Past Medical History:   Diagnosis Date    Asthma        Past Surgical History:  History reviewed. No pertinent surgical history.    Family History:  History reviewed. No pertinent family history.    Social History:  Social History     Tobacco Use    Smoking status: Unknown       Allergies:  No Known Allergies    PCP: No primary care provider on file.    Current Meds:   No current facility-administered medications for this encounter.     Current Outpatient Medications   Medication Sig Dispense Refill    albuterol sulfate HFA (VENTOLIN HFA) 108 (90 Base) MCG/ACT inhaler Inhale 2 puffs into the lungs 4 times daily as needed for Wheezing 54 g 0    montelukast (SINGULAIR) 10 MG tablet Take 1 tablet by mouth nightly 30 tablet 0       Social Determinants of Health:   Social Determinants of Health     Tobacco Use: Not on file   Alcohol Use: Not on file   Financial Resource Strain: Not on file   Food Insecurity: Not on file   Transportation Needs: Not on file   Physical Activity: Not on file   Stress: Not on file   Social Connections: Not on file   Intimate Partner Violence: Not on file   Depression: Not on file   Housing Stability: Not on file   Interpersonal Safety: Not on file   Utilities: Not on file       PHYSICAL EXAM   Physical Exam  Vitals and nursing note reviewed.    Constitutional:       General: He is not in acute distress.     Appearance: Normal appearance.   HENT:      Nose: Rhinorrhea present.   Cardiovascular:      Rate and Rhythm: Bradycardia present.   Pulmonary:      Effort: Pulmonary effort is normal.   Skin:     General: Skin is warm.      Capillary Refill: Capillary refill takes less than 2 seconds.      Findings: No bruising or erythema.   Neurological:      Mental Status: He is alert.           SCREENINGS   History: 0  ECG: 0  Patient Age: 36  Risk Factors: 1  Troponin: 0  Heart Score Total: 1                LAB, EKG AND DIAGNOSTIC RESULTS   Labs:  No results found for this or any previous visit (from the past 12 hour(s)).  EKG: EKG interpreted by me. Shows Sinus Bradycardia with a HR of 47. No [email or web address] ST elevation precordial V4-V6 without reciprocal changes.    Radiologic Studies:  Non-plain film images such as CT, Ultrasound and MRI are read by the radiologist. Plain radiographic images are visualized and preliminarily interpreted by the ED Physician with the following findings: Not Applicable.    Interpretation per the Radiologist below, if available at the time of this note:  XR CHEST PORTABLE   Final Result   No acute cardiopulmonary process.           ED COURSE and DIFFERENTIAL DIAGNOSIS/MDM   9:58 PM Differential and Considerations:Seasonal allergies, allergy induced bronchospasm, low suspicion for PNA or infectious etiology. Pt has brady cardia but reports this he believes is baseline and been told before along with possible murmur which was not appreciated today. Mild ST elevation precordial leads will perform cardiac enzymes but pt denies chest pain and reports it as tightness.    Records Reviewed (source and summary of external notes): Prior medical records and Nursing notes    Vitals:    Vitals:    05/21/22 1329 05/21/22 1335 05/21/22 1339 05/21/22 1353   BP: 115/81 119/79     Pulse:       Resp:    16   Temp:       TempSrc:       SpO2: 99% 99%  99%         ED COURSE  ED Course as of 05/23/22 2158   Mon May 21, 2022   1211 Patient reports some improvement in his chest tightness.  Explained we would repeat his troponin in about 10 minutes.  He endorsed understanding. [LG]      ED Course User Index  [LG] Marquette Old, MD        SEPSIS Reassessment: Sepsis reassessment not applicable    Clinical Management Tools:  Not Applicable, Chest Pain: MEDICAL DECISION MAKING:   I considered, but did not perform, additional testing such aCT Angiogram, as well as admission or transfer to a higher level of care.     I utilized an evidence-based risk rating tool (CMT) along with my training and experience to weigh the risk of discharge against the risks of further testing, imaging, or hospitalization. At this time, I estimate the risks of additional testing, imaging, or hospitalization to be equal to or greater than the risk of discharge(in the case of discharge home).      The patient's HEART Score is 1. In rare cases, I give patients with HEART Score of 4 the option of discharge, but only when they meet criteria for "Low 4," meaning that HST was used, and the 4 is not from a highly suspicious story, highly suspicious EKG, or positive cardiac enzymes.  In these selected cases, the risk of a "Low 4" is still most likely lower than the risk of admission and further testing/imaging. [id number]    SHARED DECISION MAKING:   I discussed my risk assessment with the patient. The patient understands and consents to the risk of disposition/plan, as well as the risk of uncertainty in estimating outcomes. [id number]          Patient was given the following medications:  Medications   ipratropium 0.5 mg-albuterol 2.5 mg (DUONEB) nebulizer solution 1 Dose (1 Dose Inhalation Given 05/21/22 1136)       CONSULTS: See ED Course/MDM for further details.  None     Social Determinants affecting Diagnosis/Treatment: Patient  lacks a PCP.     Smoking Cessation: Not  Applicable    PROCEDURES   Unless otherwise noted above, none.  Procedures      CRITICAL CARE TIME   Patient does not meet Critical Care Time, 0 minutes    ED IMPRESSION     1. Chest tightness    2. Atypical chest pain    3. Bradycardia    4. Seasonal allergies          DISPOSITION/PLAN   DISPOSITION Decision To Discharge 05/21/2022 01:39:10 PM    Discharge Note: The patient is stable for discharge home. The signs, symptoms, diagnosis, and discharge instructions have been discussed, understanding conveyed, and agreed upon. The patient is to follow up as recommended or return to ER should their symptoms worsen.      PATIENT REFERRED TO:  Jolyne Loa, APRN - CNP  [address]  [place] [address]  [phone]      for Cardiology    Maryanna Shape., MD  [address] [place]  [place] [address]  [phone]    Schedule an appointment as soon as possible for a visit   For PCP    [facility] EMERGENCY DEPT  [address]  [place] [address]  [phone]    If symptoms worsen        DISCHARGE MEDICATIONS:     Medication List        START taking these medications      albuterol sulfate HFA 108 (90 Base) MCG/ACT inhaler  Commonly known as: Ventolin HFA  Inhale 2 puffs into the lungs 4 times daily as needed for Wheezing     montelukast 10 MG tablet  Commonly known as: Singulair  Take 1 tablet by mouth nightly               Where to Get Your Medications        These medications were sent to [facility] [address], [place] - [address] - P [phone] Carmon Ginsberg [phone]  [address], [place] [address]      Phone: [phone]   albuterol sulfate HFA 108 (90 Base) MCG/ACT inhaler  montelukast 10 MG tablet           DISCONTINUED MEDICATIONS:  Discharge Medication List as of 05/21/2022  1:40 PM          I am the Primary Clinician of Record: Marquette Old, MD (electronically signed)    (Please note that parts of this dictation were completed with voice recognition software. Quite often unanticipated  grammatical, syntax, homophones, and other interpretive errors are inadvertently transcribed by the computer software. Please disregards these errors. Please excuse any errors that have escaped final proofreading.)     Marquette Old, MD  05/23/22 2158

## 2022-05-21 NOTE — Discharge Instructions (Addendum)
Thank you!  Thank you for allowing me to care for you in the emergency department. It is my goal to provide you with excellent care.  Please fill out the survey that will come to you by mail or email since we listen to your feedback!     Below you will find a list of your tests from today's visit.  Should you have any questions, please do not hesitate to call the emergency department.    Labs  Recent Results (from the past 12 hour(s))   EKG 12 Lead    Collection Time: 05/21/22 10:52 AM   Result Value Ref Range    Ventricular Rate 47 BPM    Atrial Rate 0 BPM    P-R Interval 155 ms    QRS Duration 113 ms    Q-T Interval 426 ms    QTc Calculation (Bazett) 377 ms    P Axis 62 degrees    R Axis -34 degrees    T Axis 53 degrees    Diagnosis       Sinus bradycardia  Borderline IVCD with LAD  Lateral infarct, acute (LAD)  ST elevation, consider inferior injury  >>> Acute MI <<<     CBC with Auto Differential    Collection Time: 05/21/22 11:07 AM   Result Value Ref Range    WBC 4.8 4.1 - 11.1 K/uL    RBC 4.76 4.10 - 5.70 M/uL    Hemoglobin 13.4 12.1 - 17.0 g/dL    Hematocrit 16.1 09.6 - 50.3 %    MCV 84.7 80.0 - 99.0 FL    MCH 28.2 26.0 - 34.0 PG    MCHC 33.3 30.0 - 36.5 g/dL    RDW 04.5 40.9 - 81.1 %    Platelets 254 150 - 400 K/uL    MPV 9.9 8.9 - 12.9 FL    Nucleated RBCs 0.0 0.0 PER 100 WBC    nRBC 0.00 0.00 - 0.01 K/uL    Neutrophils % 33 32 - 75 %    Lymphocytes % 48 12 - 49 %    Monocytes % 8 5 - 13 %    Eosinophils % 10 (H) 0 - 7 %    Basophils % 1 0 - 1 %    Immature Granulocytes % 0 0 - 0.5 %    Neutrophils Absolute 1.6 (L) 1.8 - 8.0 K/UL    Lymphocytes Absolute 2.3 0.8 - 3.5 K/UL    Monocytes Absolute 0.4 0.0 - 1.0 K/UL    Eosinophils Absolute 0.5 (H) 0.0 - 0.4 K/UL    Basophils Absolute 0.1 0.0 - 0.1 K/UL    Immature Granulocytes Absolute 0.0 0.00 - 0.04 K/UL    Differential Type AUTOMATED         Radiologic Studies  XR CHEST PORTABLE    (Results Pending)      ------------------------------------------------------------------------------------------------------------  The exam and treatment you received in the Emergency Department were for an urgent problem and are not intended as complete care. It is important that you follow-up with a doctor, nurse practitioner, or physician assistant to:  (1) confirm your diagnosis,  (2) re-evaluation of changes in your illness and treatment, and (3) for ongoing care. Please take your discharge instructions with you when you go to your follow-up appointment.     If you have any problem arranging a follow-up appointment, contact the Emergency Department.  If your symptoms become worse or you do not improve as expected and you are unable to  reach your health care provider, please return to the Emergency Department. We are available 24 hours a day.     If a prescription has been provided, please have it filled as soon as possible to prevent a delay in treatment. If you have any questions or reservations about taking the medication due to side effects or interactions with other medications, please call your primary care provider or contact the ER.

## 2022-05-23 LAB — EKG 12-LEAD
Atrial Rate: 0 {beats}/min
P Axis: 62 degrees
P-R Interval: 155 ms
Q-T Interval: 426 ms
QRS Duration: 113 ms
QTc Calculation (Bazett): 377 ms
R Axis: -34 degrees
T Axis: 53 degrees
Ventricular Rate: 47 {beats}/min

## 2022-08-02 ENCOUNTER — Inpatient Hospital Stay
Admit: 2022-08-02 | Discharge: 2022-08-02 | Disposition: A | Discharge status: Home / Self Care | Payer: PRIVATE HEALTH INSURANCE | Attending: Emergency Medicine

## 2022-08-02 DIAGNOSIS — J4521 Mild intermittent asthma with (acute) exacerbation: Secondary | ICD-10-CM

## 2022-08-02 MED ORDER — ALBUTEROL SULFATE HFA 108 (90 BASE) MCG/ACT IN AERS
10890 (90 Base) MCG/ACT | Freq: Four times a day (QID) | RESPIRATORY_TRACT | 0 refills | Status: AC | PRN
Start: 2022-08-02 — End: ?

## 2022-08-02 MED ORDER — PREDNISONE 20 MG PO TABS
20 MG | ORAL_TABLET | Freq: Every day | ORAL | 0 refills | Status: AC
Start: 2022-08-02 — End: 2022-08-07

## 2022-08-02 MED ORDER — IPRATROPIUM-ALBUTEROL 0.5-2.5 (3) MG/3ML IN SOLN
RESPIRATORY_TRACT | Status: AC
Start: 2022-08-02 — End: 2022-08-02
  Administered 2022-08-02: 13:00:00 1 via RESPIRATORY_TRACT

## 2022-08-02 MED ORDER — STERILE WATER FOR INJECTION (MIXTURES ONLY)
125 MG | Freq: Once | INTRAMUSCULAR | Status: AC
Start: 2022-08-02 — End: 2022-08-02
  Administered 2022-08-02: 13:00:00 125 mg via INTRAMUSCULAR

## 2022-08-02 MED FILL — METHYLPREDNISOLONE SODIUM SUCC 125 MG IJ SOLR: 125 MG | INTRAMUSCULAR | Qty: 125

## 2022-08-02 MED FILL — IPRATROPIUM-ALBUTEROL 0.5-2.5 (3) MG/3ML IN SOLN: RESPIRATORY_TRACT | Qty: 3

## 2022-08-02 NOTE — ED Provider Notes (Signed)
[facility] EMERGENCY DEPT  EMERGENCY DEPARTMENT HISTORY AND PHYSICAL EXAM      Date: 08/02/2022  Patient Name: Grant Rush  MRN: [id number]  Birthdate: 10-Mar-1990  Date of evaluation: 08/02/2022  Provider: Domingo Dimes, MD   Note Started: 8:54 AM EDT 08/02/22    HISTORY OF PRESENT ILLNESS     Chief Complaint   Patient presents with    Asthma       History Provided By: Patient    HPI: Grant Rush is a 32 y.o. male     PAST MEDICAL HISTORY   Past Medical History:  Past Medical History:   Diagnosis Date    Asthma        Past Surgical History:  History reviewed. No pertinent surgical history.    Family History:  History reviewed. No pertinent family history.    Social History:  Social History     Tobacco Use    Smoking status: Unknown       Allergies:  No Known Allergies    PCP: No primary care provider on file.    Current Meds:   Current Facility-Administered Medications   Medication Dose Route Frequency Provider Last Rate Last Admin    methylPREDNISolone sodium succ (SOLU-MEDROL) 125 mg in sterile water 2 mL injection  125 mg IntraMUSCular Once Benn-Thompson, Hilberto Burzynski, MD         Current Outpatient Medications   Medication Sig Dispense Refill    albuterol sulfate HFA (VENTOLIN HFA) 108 (90 Base) MCG/ACT inhaler Inhale 2 puffs into the lungs 4 times daily as needed for Wheezing 54 g 0    montelukast (SINGULAIR) 10 MG tablet Take 1 tablet by mouth nightly 30 tablet 0       Social Determinants of Health:   Social Determinants of Health     Tobacco Use: Not on file   Alcohol Use: Not on file   Financial Resource Strain: Not on file   Food Insecurity: Not on file   Transportation Needs: Not on file   Physical Activity: Not on file   Stress: Not on file   Social Connections: Not on file   Intimate Partner Violence: Not on file   Depression: Not on file   Housing Stability: Not on file   Interpersonal Safety: Not on file   Utilities: Not on file       PHYSICAL EXAM   Physical Exam  Vitals and nursing note reviewed.    Constitutional:       General: He is not in acute distress.     Appearance: Normal appearance. He is normal weight. He is not ill-appearing, toxic-appearing or diaphoretic.   HENT:      Head: Normocephalic and atraumatic.      Nose: Nose normal.      Mouth/Throat:      Mouth: Mucous membranes are moist.      Pharynx: No oropharyngeal exudate.   Eyes:      Extraocular Movements: Extraocular movements intact.      Conjunctiva/sclera: Conjunctivae normal.      Pupils: Pupils are equal, round, and reactive to light.   Cardiovascular:      Rate and Rhythm: Normal rate and regular rhythm.      Pulses: Normal pulses.      Heart sounds: Normal heart sounds.   Pulmonary:      Effort: Pulmonary effort is normal.      Breath sounds: Normal breath sounds.   Abdominal:      General: Bowel sounds are  normal.      Palpations: Abdomen is soft.      Tenderness: There is no abdominal tenderness.   Musculoskeletal:         General: Normal range of motion.      Cervical back: Normal range of motion and neck supple.   Skin:     General: Skin is warm and dry.      Capillary Refill: Capillary refill takes less than 2 seconds.   Neurological:      General: No focal deficit present.      Mental Status: He is alert and oriented to person, place, and time.   Psychiatric:         Mood and Affect: Mood normal.         Behavior: Behavior normal.           SCREENINGS              LAB, EKG AND DIAGNOSTIC RESULTS   Labs:  No results found for this or any previous visit (from the past 12 hour(s)).    EKG: Not Applicable    Radiologic Studies:  Non-plain film images such as CT, Ultrasound and MRI are read by the radiologist. Plain radiographic images are visualized and preliminarily interpreted by the ED Physician with the following findings: Not Applicable.    Interpretation per the Radiologist below, if available at the time of this note:  No orders to display        ED COURSE and DIFFERENTIAL DIAGNOSIS/MDM   CC/HPI Summary, DDx, ED Course, and  Reassessment: 32 year old male presents with complaint of chest tightness difficulty breathing over the last 2 weeks, patient states he is a smoker, has history of childhood asthma which has not bothered him for the last 24 years until 2 weeks ago, patient denies any fever or chills    Records Reviewed (source and summary of external notes): Prior medical records and Nursing notes    Vitals:    Vitals:    08/02/22 0845   BP: 116/69   Pulse: 61   Resp: 23   Temp: 98.3 F (36.8 C)   SpO2: 100%        ED COURSE  DuoNeb inhalation therapy  Solu-Medrol IM       Disposition Considerations (Tests not done, Shared Decision Making, Pt Expectation of Test or Treatment.):  Patient encouraged to stop smoking, patient provided with prescription for albuterol inhaler on discharge    Patient was given the following medications:  Medications   methylPREDNISolone sodium succ (SOLU-MEDROL) 125 mg in sterile water 2 mL injection (has no administration in time range)   ipratropium 0.5 mg-albuterol 2.5 mg (DUONEB) nebulizer solution 1 Dose (1 Dose Inhalation Given 08/02/22 0850)       CONSULTS: (Who and What was discussed)  None     Social Determinants affecting Dx or Tx: None    Smoking Cessation: Smoking Cessation Counseling  Discussed the risks of smoking tobacco products and the long term sequelae of tobacco use with the patient such as increased risk of heart attack, stroke, peripheral artery disease and cancer. The patient verbalized their understanding and were provided resources if asked. Counseled patient for approximately 3 minutes.     PROCEDURES   Unless otherwise noted above, none.  Procedures      CRITICAL CARE TIME   Patient does not meet Critical Care Time, 0 minutes    FINAL IMPRESSION   No diagnosis found.      DISPOSITION/PLAN  DISPOSITION      Discharge Note: The patient is stable for discharge home. The signs, symptoms, diagnosis, and discharge instructions have been discussed, understanding conveyed, and agreed  upon. The patient is to follow up as recommended or return to ER should their symptoms worsen.      PATIENT REFERRED TO:  No follow-up provider specified.      DISCHARGE MEDICATIONS:     Medication List        ASK your doctor about these medications      albuterol sulfate HFA 108 (90 Base) MCG/ACT inhaler  Commonly known as: Ventolin HFA  Inhale 2 puffs into the lungs 4 times daily as needed for Wheezing     montelukast 10 MG tablet  Commonly known as: Singulair  Take 1 tablet by mouth nightly                DISCONTINUED MEDICATIONS:  Current Discharge Medication List          I am the Primary Clinician of Record: Domingo Dimes, MD (electronically signed)    (Please note that parts of this dictation were completed with voice recognition software. Quite often unanticipated grammatical, syntax, homophones, and other interpretive errors are inadvertently transcribed by the computer software. Please disregards these errors. Please excuse any errors that have escaped final proofreading.)     Domingo Dimes, MD  08/02/22 [phone]

## 2022-08-02 NOTE — ED Triage Notes (Signed)
Pt presents with c/o asthma exacerbation x 2 weeks. Pt reports he does not has his inhaler. PT denies any pain at this time. Pt noted to have mild difficulty breathing.

## 2023-02-05 ENCOUNTER — Inpatient Hospital Stay
Admit: 2023-02-05 | Discharge: 2023-02-05 | Disposition: A | Discharge status: Home / Self Care | Payer: PRIVATE HEALTH INSURANCE | Admitting: Emergency Medicine

## 2023-02-05 ENCOUNTER — Emergency Department: Admit: 2023-02-05 | Payer: PRIVATE HEALTH INSURANCE

## 2023-02-05 DIAGNOSIS — S46912A Strain of unspecified muscle, fascia and tendon at shoulder and upper arm level, left arm, initial encounter: Secondary | ICD-10-CM

## 2023-02-05 MED ORDER — LIDOCAINE 4 % EX PTCH
4 | MEDICATED_PATCH | Freq: Every day | CUTANEOUS | 0 refills | Status: AC
Start: 2023-02-05 — End: 2023-02-15

## 2023-02-05 MED ORDER — IBUPROFEN 800 MG PO TABS
800 | ORAL_TABLET | Freq: Three times a day (TID) | ORAL | 0 refills | Status: AC | PRN
Start: 2023-02-05 — End: ?

## 2023-02-05 NOTE — ED Provider Notes (Signed)
 [facility] EMERGENCY DEPT  EMERGENCY DEPARTMENT HISTORY AND PHYSICAL EXAM      Date: 02/05/2023  Patient Name: Grant Rush  MRN: [id number]  Birthdate: 16-Nov-1990  Date of evaluation: 02/05/2023  Provider: Tinnie Lusher, MD   Note Started: 10:43 AM EST 02/05/23    HISTORY OF PRESENT ILLNESS     Chief Complaint   Patient presents with    Shoulder Pain       History Provided By: Patient    HPI: Grant Rush is a 33 y.o. male presents with two days of left shoulder pain with no known inciting event or injury. Pain is over anterior aspect. Pain is worsened by lifting arm over head. It is worse in morning but loosens up some during the day. He denies repetitive work.    PAST MEDICAL HISTORY   Past Medical History:  Past Medical History:   Diagnosis Date    Asthma        Past Surgical History:  History reviewed. No pertinent surgical history.    Family History:  History reviewed. No pertinent family history.    Social History:  Social History     Tobacco Use    Smoking status: Unknown       Allergies:  No Known Allergies    PCP: No primary care provider on file.    Current Meds:   No current facility-administered medications for this encounter.     Current Outpatient Medications   Medication Sig Dispense Refill    lidocaine  (HM LIDOCAINE  PATCH) 4 % external patch Place 1 patch onto the skin daily for 10 days 10 patch 0    ibuprofen  (ADVIL ;MOTRIN ) 800 MG tablet Take 1 tablet by mouth every 8 hours as needed for Pain 40 tablet 0    albuterol  sulfate HFA (VENTOLIN  HFA) 108 (90 Base) MCG/ACT inhaler Inhale 2 puffs into the lungs 4 times daily as needed for Wheezing 18 g 0    montelukast  (SINGULAIR ) 10 MG tablet Take 1 tablet by mouth nightly 30 tablet 0       Social Determinants of Health:   Social Determinants of Health     Tobacco Use: High Risk (04/09/2022)    Received from [facility], [facility]    Patient History     Smoking Tobacco Use: Every Day     Smokeless Tobacco Use: Never     Passive Exposure: Not on file   Alcohol Use:  Patient Declined (02/05/2023)    AUDIT-C     Frequency of Alcohol Consumption: Patient declined     Average Number of Drinks: Patient declined     Frequency of Binge Drinking: Patient declined   Physicist, Medical Strain: Not on file   Food Insecurity: Not on file   Transportation Needs: Not on file   Physical Activity: Not on file   Stress: Not on file   Social Connections: Not on file   Intimate Partner Violence: Not on file   Depression: Not on file   Housing Stability: Not on file   Interpersonal Safety: Not on file   Utilities: Not on file       PHYSICAL EXAM   Physical Exam  Vitals and nursing note reviewed.   Constitutional:       General: He is not in acute distress.     Appearance: Normal appearance.   Cardiovascular:      Rate and Rhythm: Normal rate.   Pulmonary:      Effort: Pulmonary effort is normal.   Musculoskeletal:  General: Tenderness present. No swelling or deformity.      Comments: Ttp over deltoid insertion  Painful AROM but able to lift arm over head  5/5 UE strength   Skin:     General: Skin is warm.      Capillary Refill: Capillary refill takes less than 2 seconds.      Findings: No erythema.      Comments: No overlying erythema, or warmth or crepitus   Neurological:      Mental Status: He is alert.         SCREENINGS                  LAB, EKG AND DIAGNOSTIC RESULTS   Labs:  No results found for this or any previous visit (from the past 12 hour(s)).    EKG: Not Applicable    Radiologic Studies:  Non-plain film images such as CT, Ultrasound and MRI are read by the radiologist. Plain radiographic images are visualized and preliminarily interpreted by the ED Physician with the following findings: Not Applicable.    Interpretation per the Radiologist below, if available at the time of this note:  XR SHOULDER LEFT (MIN 2 VIEWS)   Final Result   No acute abnormality. If there is a history of trauma, an axillary   view is recommended to exclude dislocation..      Electronically signed by  BERLE COMFORT           ED COURSE and DIFFERENTIAL DIAGNOSIS/MDM   11:27 AM Differential and Considerations: Pt presents with physical exam and history consistent with tendonopathy or soft tissue strain. Low suspicion for fracture/dislocation. Will image to rule out occult fracture. Discussed RICE therapy, anti-inflammatory and rest.    Records Reviewed (source and summary of external notes): Prior medical records and Nursing notes    Vitals:    Vitals:    02/05/23 1039   BP: 128/74   Pulse: 69   Resp: 16   Temp: 98 F (36.7 C)   SpO2: 99%        ED COURSE       SEPSIS Reassessment: Patient does NOT meet Sepsis criteria after ED workup    Clinical Management Tools:  Not Applicable    Patient was given the following medications:  Medications - No data to display    CONSULTS: See ED Course/MDM for further details.  None     Social Determinants affecting Diagnosis/Treatment: No PCP    Smoking Cessation: Not Applicable    PROCEDURES   Unless otherwise noted above, none.  Procedures      CRITICAL CARE TIME   Patient does not meet Critical Care Time, 0 minutes    ED IMPRESSION     1. Strain of left shoulder, initial encounter    2. Tendonitis of shoulder, left    3. Acute pain of left shoulder          DISPOSITION/PLAN   DISPOSITION Decision To Discharge 02/05/2023 11:26:58 AM   DISPOSITION CONDITION Stable        Discharge Note: The patient is stable for discharge home. The signs, symptoms, diagnosis, and discharge instructions have been discussed, understanding conveyed, and agreed upon. The patient is to follow up as recommended or return to ER should their symptoms worsen.      PATIENT REFERRED TO:  Tobie Newman LABOR, MD  [address] [place] [address]  [phone]    Schedule an appointment as soon as  possible for a visit   for Ortho follow-up        DISCHARGE MEDICATIONS:     Medication List        START taking these medications      ibuprofen  800 MG tablet  Commonly known as:  ADVIL ;MOTRIN   Take 1 tablet by mouth every 8 hours as needed for Pain     lidocaine  4 % external patch  Commonly known as: HM Lidocaine  Patch  Place 1 patch onto the skin daily for 10 days            ASK your doctor about these medications      albuterol  sulfate HFA 108 (90 Base) MCG/ACT inhaler  Commonly known as: Ventolin  HFA  Inhale 2 puffs into the lungs 4 times daily as needed for Wheezing     montelukast  10 MG tablet  Commonly known as: Singulair   Take 1 tablet by mouth nightly               Where to Get Your Medications        These medications were sent to [facility] [address], [place] - [address] - P [phone] [facility] [phone]  [address], [place] [address]      Phone: [phone]   ibuprofen  800 MG tablet  lidocaine  4 % external patch           DISCONTINUED MEDICATIONS:  Current Discharge Medication List          I am the Primary Clinician of Record: Tinnie Lusher, MD (electronically signed)    (Please note that parts of this dictation were completed with voice recognition software. Quite often unanticipated grammatical, syntax, homophones, and other interpretive errors are inadvertently transcribed by the computer software. Please disregards these errors. Please excuse any errors that have escaped final proofreading.)     Lusher Tinnie, MD  02/05/23 1206

## 2023-02-05 NOTE — Discharge Instructions (Addendum)
 Thank you for choosing our Emergency Department for your care.  It is our privilege to care for you in your time of need.  In the next several days, you may receive a survey via email or mailed to your home about your experience with our team.  We would greatly appreciate you taking a few minutes to complete the survey, as we use this information to learn what we have done well and what we could be doing better. Thank you for trusting us  with your care!    Below you will find a list of your tests from today's visit.   Labs and Radiology Studies  No results found for this or any previous visit (from the past 12 hour(s)).  XR SHOULDER LEFT (MIN 2 VIEWS)    Result Date: 02/05/2023  EXAM: XR SHOULDER LEFT (MIN 2 VIEWS) INDICATION: Left shoulder pain. COMPARISON: None. FINDINGS: Three views of the left shoulder demonstrate no fracture, dislocation or other acute abnormality.     No acute abnormality. If there is a history of trauma, an axillary view is recommended to exclude dislocation.. Electronically signed by BERLE COMFORT    ------------------------------------------------------------------------------------------------------------  The evaluation and treatment you received in the Emergency Department were for an urgent problem. It is important that you follow-up with a doctor, nurse practitioner, or physician assistant to:  (1) confirm your diagnosis,  (2) re-evaluation of changes in your illness and treatment, and (3) for ongoing care. Please take your discharge instructions with you when you go to your follow-up appointment.     If you have any problem arranging a follow-up appointment, contact us !  If your symptoms become worse or you do not improve as expected, please return to us . We are available 24 hours a day.     If a prescription has been provided, please fill it as soon as possible to prevent a delay in treatment. If you have any questions or reservations about taking the medication due to side  effects or interactions with other medications, please call your primary care provider or contact us  directly.  Again, THANK YOU for choosing us  to care for YOU!

## 2023-02-05 NOTE — ED Triage Notes (Signed)
 PT reports left shoulder pain that began 4 days ago when he woke up. Reports decreased ROM. Denies any known injury

## 2023-04-26 ENCOUNTER — Ambulatory Visit (HOSPITAL_COMMUNITY)
Admission: EM | Admit: 2023-04-26 | Discharge: 2023-04-26 | Disposition: A | Discharge status: Home / Self Care | Attending: Internal Medicine | Admitting: Internal Medicine

## 2023-04-26 ENCOUNTER — Encounter (HOSPITAL_COMMUNITY): Payer: Self-pay

## 2023-04-26 DIAGNOSIS — J029 Acute pharyngitis, unspecified: Secondary | ICD-10-CM

## 2023-04-26 DIAGNOSIS — R0609 Other forms of dyspnea: Secondary | ICD-10-CM

## 2023-04-26 MED ORDER — AZELASTINE HCL 0.1 % NA SOLN: 1.0000 | Freq: Two times a day (BID) | NASAL | 1 refills | Status: AC

## 2023-04-26 MED ORDER — BENZONATATE 200 MG PO CAPS: 200.0000 mg | ORAL_CAPSULE | Freq: Three times a day (TID) | ORAL | 0 refills | Status: DC | PRN

## 2023-04-26 MED ORDER — ALBUTEROL SULFATE HFA 108 (90 BASE) MCG/ACT IN AERS: 1.0000 | INHALATION_SPRAY | Freq: Four times a day (QID) | RESPIRATORY_TRACT | 0 refills | Status: AC | PRN

## 2023-04-26 NOTE — Discharge Instructions (Addendum)
 1. Viral pharyngitis (Primary) - azelastine (ASTELIN) 0.1 % nasal spray; Place 1 spray into both nostrils 2 (two) times daily. Use in each nostril as directed  Dispense: 30 mL; Refill: 1 - benzonatate (TESSALON) 200 MG capsule; Take 1 capsule (200 mg total) by mouth 3 (three) times daily as needed for cough.  Dispense: 20 capsule; Refill: 0  2. Dyspnea on exertion - albuterol (VENTOLIN HFA) 108 (90 Base) MCG/ACT inhaler; Inhale 1-2 puffs into the lungs every 6 (six) hours as needed for wheezing or shortness of breath.  Dispense: 6.7 g; Refill: 0 -Continue to monitor symptoms if there is any change in current symptoms or escalation to develop new symptoms follow-up for further evaluation and management in the emergency department.

## 2023-04-26 NOTE — ED Provider Notes (Signed)
 [facility]  Note:  This document was prepared using Dragon voice recognition software and may include unintentional dictation errors.  MRN: [id number] DOB: October 03, 1990  Subjective:   Evan Leblanc. is a 33 y.o. male presenting for sore throat, nasal congestion, mild cough and occasional dyspnea with exertion.  Patient reports past history of asthma but no exacerbations in many years.  Patient has been taking daily cetirizine for allergy symptoms he believes that allergies are causing his sore throat and cough.  Patient denies taking any other medication to treat symptoms.  No known sick contacts.  No chest pain, shortness of breath, weakness, dizziness at this time.  No current facility-administered medications for this encounter.  Current Outpatient Medications:    albuterol (VENTOLIN HFA) 108 (90 Base) MCG/ACT inhaler, Inhale 1-2 puffs into the lungs every 6 (six) hours as needed for wheezing or shortness of breath., Disp: 6.7 g, Rfl: 0   azelastine (ASTELIN) 0.1 % nasal spray, Place 1 spray into both nostrils 2 (two) times daily. Use in each nostril as directed, Disp: 30 mL, Rfl: 1   benzonatate (TESSALON) 200 MG capsule, Take 1 capsule (200 mg total) by mouth 3 (three) times daily as needed for cough., Disp: 20 capsule, Rfl: 0   HYDROcodone-acetaminophen (NORCO/VICODIN) 5-325 MG tablet, Take 1 tablet by mouth every 6 (six) hours as needed for moderate pain. (Patient not taking: Reported on 01/01/2022), Disp: 15 tablet, Rfl: 0   ketorolac (TORADOL) 10 MG tablet, Take 1 tablet (10 mg total) by mouth every 6 (six) hours as needed. (Patient not taking: Reported on 01/01/2022), Disp: 12 tablet, Rfl: 0   ondansetron (ZOFRAN-ODT) 4 MG disintegrating tablet, Take 1 tablet (4 mg total) by mouth every 6 (six) hours as needed for nausea or vomiting. (Patient not taking: Reported on 01/01/2022), Disp: 10 tablet, Rfl: 0   No Known Allergies  Past Medical History:  Diagnosis Date    Asthma      Past Surgical History:  Procedure Laterality Date   FRACTURE SURGERY     left ankle    History reviewed. No pertinent family history.  Social History   Tobacco Use   Smoking status: Every Day    Current packs/day: 0.50    Average packs/day: 0.5 packs/day for 10.0 years (5.0 ttl pk-yrs)    Types: Cigarettes   Smokeless tobacco: Never  Vaping Use   Vaping status: Every Day  Substance Use Topics   Alcohol use: No    Comment: 1-2x/mo   Drug use: Yes    Frequency: 14.0 times per week    Types: Marijuana    ROS Refer to HPI for ROS details.  Objective:   Vitals: BP 107/68 (BP Location: Left Arm)   Pulse 60   Resp 16   SpO2 95%   Physical Exam Vitals and nursing note reviewed.  Constitutional:      General: He is not in acute distress.    Appearance: He is well-developed. He is not ill-appearing or toxic-appearing.  HENT:     Head: Normocephalic.     Nose: Congestion present. No rhinorrhea.     Mouth/Throat:     Pharynx: Posterior oropharyngeal erythema present. No pharyngeal swelling, oropharyngeal exudate or uvula swelling.     Tonsils: 0 on the right. 0 on the left.  Eyes:     Conjunctiva/sclera: Conjunctivae normal.  Cardiovascular:     Rate and Rhythm: Normal rate and regular rhythm.     Heart sounds: No murmur heard.  Pulmonary:     Effort: Pulmonary effort is normal. No respiratory distress.     Breath sounds: Normal breath sounds.  Musculoskeletal:        General: No swelling.  Skin:    General: Skin is warm and dry.  Neurological:     General: No focal deficit present.     Mental Status: He is alert and oriented to person, place, and time.  Psychiatric:        Mood and Affect: Mood normal.        Behavior: Behavior normal.     Procedures  No results found for this or any previous visit (from the past 24 hours).  Assessment and Plan :   PDMP not reviewed this encounter.  1. Viral pharyngitis   2. Dyspnea on exertion    1.  Viral pharyngitis (Primary) - azelastine (ASTELIN) 0.1 % nasal spray; Place 1 spray into both nostrils 2 (two) times daily. Use in each nostril as directed  Dispense: 30 mL; Refill: 1 - benzonatate (TESSALON) 200 MG capsule; Take 1 capsule (200 mg total) by mouth 3 (three) times daily as needed for cough.  Dispense: 20 capsule; Refill: 0  2. Dyspnea on exertion - albuterol (VENTOLIN HFA) 108 (90 Base) MCG/ACT inhaler; Inhale 1-2 puffs into the lungs every 6 (six) hours as needed for wheezing or shortness of breath.  Dispense: 6.7 g; Refill: 0 -Continue to monitor symptoms if there is any change in current symptoms or escalation to develop new symptoms follow-up for further evaluation and management in the emergency department.  Lucky Cowboy   [place], [place], [place] 04/26/23 [phone]

## 2023-04-26 NOTE — ED Triage Notes (Addendum)
 Patient reports that he began having a sore throat and feeling like his throat is swelling x 4 days and began to get worse 4 days ago. Patient is able to talk in complete sentences.  Patient states he thinks the pollen is causing his symptoms.  Patient states he has been taking [facility] allergy Relief.

## 2023-04-30 ENCOUNTER — Emergency Department (HOSPITAL_COMMUNITY)

## 2023-04-30 ENCOUNTER — Other Ambulatory Visit: Payer: Self-pay

## 2023-04-30 ENCOUNTER — Emergency Department (HOSPITAL_COMMUNITY): Admission: EM | Admit: 2023-04-30 | Discharge: 2023-04-30 | Disposition: A | Discharge status: Home / Self Care

## 2023-04-30 ENCOUNTER — Encounter (HOSPITAL_COMMUNITY): Payer: Self-pay | Admitting: Emergency Medicine

## 2023-04-30 DIAGNOSIS — R0602 Shortness of breath: Secondary | ICD-10-CM | POA: Diagnosis present

## 2023-04-30 DIAGNOSIS — J45901 Unspecified asthma with (acute) exacerbation: Secondary | ICD-10-CM | POA: Insufficient documentation

## 2023-04-30 LAB — BASIC METABOLIC PANEL WITH GFR
Anion gap: 10 (ref 5–15)
BUN: 18 mg/dL (ref 6–20)
CO2: 22 mmol/L (ref 22–32)
Calcium: 8.9 mg/dL (ref 8.9–10.3)
Chloride: 102 mmol/L (ref 98–111)
Creatinine, Ser: 1.1 mg/dL (ref 0.61–1.24)
GFR, Estimated: >60 mL/min (ref >60)
Glucose, Bld: 96 mg/dL (ref 70–99)
Potassium: 3.6 mmol/L (ref 3.5–5.1)
Sodium: 134 mmol/L — ABNORMAL LOW (ref 135–145)

## 2023-04-30 LAB — CBC
HCT: 39 % (ref 39.0–52.0)
Hemoglobin: 12.7 g/dL — ABNORMAL LOW (ref 13.0–17.0)
MCH: 28.2 pg (ref 26.0–34.0)
MCHC: 32.6 g/dL (ref 30.0–36.0)
MCV: 86.5 fL (ref 80.0–100.0)
Platelets: 249 10*3/uL (ref 150–400)
RBC: 4.51 MIL/uL (ref 4.22–5.81)
RDW: 13.2 % (ref 11.5–15.5)
WBC: 5.6 10*3/uL (ref 4.0–10.5)
nRBC: 0 % (ref 0.0–0.2)

## 2023-04-30 LAB — TROPONIN I (HIGH SENSITIVITY): Troponin I (High Sensitivity): 3 ng/L (ref <18)

## 2023-04-30 MED ORDER — IPRATROPIUM-ALBUTEROL 0.5-2.5 (3) MG/3ML IN SOLN
3.0000 mL | Freq: Once | RESPIRATORY_TRACT | Status: AC
  Administered 2023-04-30: 3 mL via RESPIRATORY_TRACT
  Filled 2023-04-30: qty 3

## 2023-04-30 MED ORDER — PREDNISONE 20 MG PO TABS
60.0000 mg | ORAL_TABLET | Freq: Once | ORAL | Status: AC
  Administered 2023-04-30: 60 mg via ORAL
  Filled 2023-04-30: qty 3

## 2023-04-30 MED ORDER — ALBUTEROL SULFATE HFA 108 (90 BASE) MCG/ACT IN AERS: 2.0000 | INHALATION_SPRAY | RESPIRATORY_TRACT | 0 refills | Status: AC | PRN

## 2023-04-30 MED ORDER — ALBUTEROL SULFATE HFA 108 (90 BASE) MCG/ACT IN AERS
2.0000 | INHALATION_SPRAY | Freq: Once | RESPIRATORY_TRACT | Status: AC
  Administered 2023-04-30: 2 via RESPIRATORY_TRACT
  Filled 2023-04-30: qty 6.7

## 2023-04-30 MED ORDER — PREDNISONE 50 MG PO TABS: ORAL_TABLET | ORAL | 0 refills | Status: DC

## 2023-04-30 NOTE — Discharge Instructions (Addendum)
Your workup today was reassuring.  Please take the prednisone as prescribed.  Use 2 puffs of the albuterol every 4 hours and follow-up with your doctor.  Return to the ER for worsening symptoms.

## 2023-04-30 NOTE — ED Provider Notes (Signed)
 [place] EMERGENCY DEPARTMENT AT [facility] Provider Note   CSN: [id number] Arrival date & time: 04/30/23  1631     History  Chief Complaint  Patient presents with   Shortness of Breath    Evan Leblanc. is a 33 y.o. male.  33 year old male with past medical history of marijuana use and asthma presenting to the emergency department today with cough and shortness of breath.  The patient states this been going now for the past 3 to 4 days.  He states that he went to urgent care a few days ago and was prescribed an inhaler.  He has not picked this up yet.  He states that he does not have money for this.  He states that he has been coughing and wheezing a lot at home.  He came to the ER today for further evaluation regarding this.  He has never been intubated or required BiPAP for his asthma.  He reports that his cough is a dry cough.  He is reporting some chest pressure in the center of his chest.  He denies a history of DVT or pulmonary embolism, recent surgeries, recent travel.  He denies any leg swelling.  Denies any hemoptysis.   Shortness of Breath      Home Medications Prior to Admission medications   Medication Sig Start Date End Date Taking? Authorizing Provider  albuterol (VENTOLIN HFA) 108 (90 Base) MCG/ACT inhaler Inhale 2 puffs into the lungs every 4 (four) hours as needed for wheezing or shortness of breath. 04/30/23  Yes Durwin Glaze, MD  predniSONE (DELTASONE) 50 MG tablet Take 1 tablet by mouth daily 04/30/23  Yes Durwin Glaze, MD  albuterol (VENTOLIN HFA) 108 (90 Base) MCG/ACT inhaler Inhale 1-2 puffs into the lungs every 6 (six) hours as needed for wheezing or shortness of breath. 04/26/23   Reddick, Johnathan B, NP  azelastine (ASTELIN) 0.1 % nasal spray Place 1 spray into both nostrils 2 (two) times daily. Use in each nostril as directed 04/26/23   Reddick, Nicola Girt B, NP  benzonatate (TESSALON) 200 MG capsule Take 1 capsule (200 mg total) by mouth 3  (three) times daily as needed for cough. 04/26/23   Reddick, Nicola Girt B, NP  HYDROcodone-acetaminophen (NORCO/VICODIN) 5-325 MG tablet Take 1 tablet by mouth every 6 (six) hours as needed for moderate pain. Patient not taking: Reported on 01/01/2022 11/24/21   Faythe Ghee, PA-C  ketorolac (TORADOL) 10 MG tablet Take 1 tablet (10 mg total) by mouth every 6 (six) hours as needed. Patient not taking: Reported on 01/01/2022 10/22/21   Chesley Noon, MD  ondansetron (ZOFRAN-ODT) 4 MG disintegrating tablet Take 1 tablet (4 mg total) by mouth every 6 (six) hours as needed for nausea or vomiting. Patient not taking: Reported on 01/01/2022 11/10/21   Ward, Layla Maw, DO      Allergies    Patient has no known allergies.    Review of Systems   Review of Systems  Respiratory:  Positive for chest tightness and shortness of breath.   All other systems reviewed and are negative.   Physical Exam Updated Vital Signs BP 126/73 (BP Location: Right Arm)   Pulse 62   Temp 98 F (36.7 C)   Resp 16   Ht 6\' 1"  (1.854 m)   Wt 74.8 kg   SpO2 100%   BMI 21.77 kg/m  Physical Exam Vitals and nursing note reviewed.   Gen: NAD, speaking in full sentences Eyes: PERRL,  EOMI HEENT: no oropharyngeal swelling Neck: trachea midline Resp: Diminished bilaterally Card: RRR, no murmurs, rubs, or gallops Abd: nontender, nondistended Extremities: no calf tenderness, no edema Vascular: 2+ radial pulses bilaterally, 2+ DP pulses bilaterally Skin: no rashes Psyc: acting appropriately   ED Results / Procedures / Treatments   Labs (all labs ordered are listed, but only abnormal results are displayed) Labs Reviewed  BASIC METABOLIC PANEL WITH GFR - Abnormal; Notable for the following components:      Result Value   Sodium 134 (*)    All other components within normal limits  CBC - Abnormal; Notable for the following components:   Hemoglobin 12.7 (*)    All other components within normal limits  TROPONIN I  (HIGH SENSITIVITY)    EKG None  Radiology DG Chest Port 1 View Result Date: 04/30/2023 CLINICAL DATA:  Shortness of breath. EXAM: PORTABLE CHEST 1 VIEW COMPARISON:  10/27/2018. FINDINGS: Bilateral lung fields are clear. Bilateral costophrenic angles are clear. Normal cardio-mediastinal silhouette. No acute osseous abnormalities. The soft tissues are within normal limits. IMPRESSION: No active disease. Electronically Signed   By: Jules Schick M.D.   On: 04/30/2023 18:19    Procedures Procedures    Medications Ordered in ED Medications  albuterol (VENTOLIN HFA) 108 (90 Base) MCG/ACT inhaler 2 puff (has no administration in time range)  predniSONE (DELTASONE) tablet 60 mg (60 mg Oral Given 04/30/23 1722)  ipratropium-albuterol (DUONEB) 0.5-2.5 (3) MG/3ML nebulizer solution 3 mL (3 mLs Nebulization Given 04/30/23 1722)    ED Course/ Medical Decision Making/ A&P                                 Medical Decision Making 33 year old male with past medical history of asthma presenting to the emergency department today with cough, wheezing, and shortness of breath.  His lung exam is reassuring here although he is not moving a lot of air.  His pulse ox is 100%.  I will further evaluate the patient here with basic labs as well as an EKG and troponin to evaluate for myocarditis or pericarditis given the chest tightness.  Will obtain a chest x-ray to evaluate for pulmonary edema, pulmonary infiltrates, or pneumothorax.  I will give the patient prednisone here as well as a DuoNeb.  I think that if his workup is reassuring that he could potentially be discharged.  Will also obtain a viral panel to evaluate for viral etiologies.  The patient remains well-appearing here.  His labs are reassuring.  Chest x-ray is unremarkable.  Viral panel is negative.  He will be discharged with symptomatic treatment with return precautions.  His pulse ox remains 100%.  Amount and/or Complexity of Data Reviewed Labs:  ordered. Radiology: ordered.  Risk Prescription drug management.           Final Clinical Impression(s) / ED Diagnoses Final diagnoses:  Asthma with acute exacerbation, unspecified asthma severity, unspecified whether persistent    Rx / DC Orders ED Discharge Orders          Ordered    predniSONE (DELTASONE) 50 MG tablet        04/30/23 1838    albuterol (VENTOLIN HFA) 108 (90 Base) MCG/ACT inhaler  Every 4 hours PRN        04/30/23 Laddie Aquas, MD 04/30/23 1839

## 2023-04-30 NOTE — ED Triage Notes (Addendum)
 Pt BIB EMS from work w/ c/o SOB and chest tightness. Pt. Is A&O x 4. Pt. States he has been fatigued and not feeling well for about 4 days, today being worse. Pt. Has hx of asthma, denies asthma attack. Per EMS pt. Had 1 duo-neb,  500 mL fluid bolus, 125 mg solumedrol in route.   Per EMS: T:98.9 P:54 RR: 18 BP: 120/80 SPO2: 100% RA CBG: 124

## 2023-12-01 ENCOUNTER — Emergency Department
Admission: EM | Admit: 2023-12-01 | Discharge: 2023-12-01 | Disposition: A | Discharge status: Home / Self Care | Attending: Emergency Medicine | Admitting: Emergency Medicine

## 2023-12-01 ENCOUNTER — Other Ambulatory Visit: Payer: Self-pay

## 2023-12-01 DIAGNOSIS — K0889 Other specified disorders of teeth and supporting structures: Secondary | ICD-10-CM | POA: Insufficient documentation

## 2023-12-01 MED ORDER — NAPROXEN 500 MG PO TABS: 500.0000 mg | ORAL_TABLET | Freq: Two times a day (BID) | ORAL | 0 refills | Status: DC

## 2023-12-01 NOTE — ED Triage Notes (Signed)
 Pt comes with left sided lower dental pain that started this morning. Pt states he took some otc pain meds with no relief.

## 2023-12-01 NOTE — Discharge Instructions (Addendum)

## 2023-12-01 NOTE — ED Provider Notes (Addendum)
 [facility] Emergency Department Provider Note     Event Date/Time   First MD Initiated Contact with Patient 12/01/23 1519     (approximate)   History   Dental Pain   HPI  Evan Leblanc. is a 33 y.o. male with a history of asthma, presents to the ED endorsing left lower dental pain.  Patient with endorsed onset of symptoms this morning.  He notes pain to his second premolar on the left lower jaw.  He denies any recent dental injury, cavity, or history of pain to the same area.  He denies any fevers, chills, sweats.  Note spontaneous purulent drainage from recent dental work reported.  He took OTC pain meds with limited benefit.   Physical Exam   Triage Vital Signs: ED Triage Vitals [12/01/23 1316]  Encounter Vitals Group     BP 118/79     Girls Systolic BP Percentile      Girls Diastolic BP Percentile      Boys Systolic BP Percentile      Boys Diastolic BP Percentile      Pulse Rate (!) 50     Resp 18     Temp 98 F (36.7 C)     Temp src      SpO2 100 %     Weight 165 lb (74.8 kg)     Height 6' 1 (1.854 m)     Head Circumference      Peak Flow      Pain Score 10     Pain Loc      Pain Education      Exclude from Growth Chart     Most recent vital signs: Vitals:   12/01/23 1316  BP: 118/79  Pulse: (!) 50  Resp: 18  Temp: 98 F (36.7 C)  SpO2: 100%    General Awake, no distress. NAD HEENT NCAT. PERRL. EOMI. No rhinorrhea. Mucous membranes are moist.  Uvula is midline and tonsils are flat.  No oropharyngeal lesions appreciated.  Patient with no focal gum swelling noted to the area of concern.  Left lower jaw at the second premolar without any surrounding gum erythema, induration, bulging, or fluctuance.  No brawny submental edema is noted. CV:  Good peripheral perfusion.  RESP:  Normal effort.    ED Results / Procedures / Treatments   Labs (all labs ordered are listed, but only abnormal results are displayed) Labs  Reviewed - No data to display   EKG   RADIOLOGY   No results found.   PROCEDURES:  Critical Care performed: No  Procedures   MEDICATIONS ORDERED IN ED: Medications - No data to display   IMPRESSION / MDM / ASSESSMENT AND PLAN / ED COURSE  I reviewed the triage vital signs and the nursing notes.                              Differential diagnosis includes, but is not limited to, dental caries, dental fracture, dental infection, gingivitis  Patient's presentation is most consistent with acute, uncomplicated illness.  Patient's diagnosis is consistent with dental pain likely due to dental caries.  No evidence of acute dental infection or dental abscess on exam.  No space-occupying lesion to the sublingual floor or oropharynx.  No evidence of acute dental abscess or infection warranting acute antibiotic management.  Patient will be discharged home with prescriptions for naproxen . Patient is to follow  up with a local dental provider as suggested, as needed or otherwise directed. Patient is given ED precautions to return to the ED for any worsening or new symptoms.     FINAL CLINICAL IMPRESSION(S) / ED DIAGNOSES   Final diagnoses:  Pain, dental     Rx / DC Orders   ED Discharge Orders          Ordered    naproxen  (NAPROSYN ) 500 MG tablet  2 times daily with meals        12/01/23 1542             Note:  This document was prepared using Dragon voice recognition software and may include unintentional dictation errors.    Loyd Candida LULLA Aldona, PA-C 12/01/23 [address] PA-C 12/01/23 1708    Willo Dunnings, MD 12/01/23 1752

## 2023-12-04 ENCOUNTER — Encounter: Payer: Self-pay | Admitting: Emergency Medicine

## 2023-12-04 ENCOUNTER — Ambulatory Visit
Admission: EM | Admit: 2023-12-04 | Discharge: 2023-12-04 | Disposition: A | Discharge status: Home / Self Care | Attending: Emergency Medicine | Admitting: Emergency Medicine

## 2023-12-04 DIAGNOSIS — K0889 Other specified disorders of teeth and supporting structures: Secondary | ICD-10-CM | POA: Diagnosis not present

## 2023-12-04 MED ORDER — AMOXICILLIN-POT CLAVULANATE 875-125 MG PO TABS: 1.0000 | ORAL_TABLET | Freq: Two times a day (BID) | ORAL | 0 refills | Status: AC

## 2023-12-04 MED ORDER — NAPROXEN 500 MG PO TABS: 500.0000 mg | ORAL_TABLET | Freq: Two times a day (BID) | ORAL | 0 refills | Status: AC

## 2023-12-04 NOTE — ED Triage Notes (Signed)
 Pt presents with left side inner mouth pain x 2 days. Pt states it feels like the inside of his mouth is swollen. He was prescribed Naproxen  at the ED on 11/2,  which has not helped with his symptoms.

## 2023-12-04 NOTE — Discharge Instructions (Signed)
 Take the Augmentin twice daily with food for 7 days for treatment of your dental infection.  Continue to take the Naprosyn  twice daily with food to help with pain.  Rinse with warm salt water, or Listerine, after each meal to remove food particles and wash away any pus that is collecting.  If you develop any increasing or swelling, fever, pain, or difficulty swallowing you to go to the emergency department at [facility] with a have an oral surgeon and also a dentist on-call.

## 2023-12-04 NOTE — ED Provider Notes (Signed)
 [facility]    CSN: [id number] Arrival date & time: 12/04/23  1131      History   Chief Complaint Chief Complaint  Patient presents with   mouth pain    HPI Evan Leblanc. is a 33 y.o. male.   HPI  33 year old male with past medical history significant for asthma presents for evaluation of left lower dental pain x 3 days.  He reports that he feels like his mouth is swollen inside.  He was seen in the ER 3 days ago for same and prescribed Naprosyn  as there was no indication of infection per the note in epic.  Past Medical History:  Diagnosis Date   Asthma     There are no active problems to display for this patient.   Past Surgical History:  Procedure Laterality Date   FRACTURE SURGERY     left ankle       Home Medications    Prior to Admission medications   Medication Sig Start Date End Date Taking? Authorizing Provider  amoxicillin-clavulanate (AUGMENTIN) 875-125 MG tablet Take 1 tablet by mouth every 12 (twelve) hours for 7 days. 12/04/23 12/11/23 Yes Bernardino Ditch, NP  albuterol  (VENTOLIN  HFA) 108 (90 Base) MCG/ACT inhaler Inhale 1-2 puffs into the lungs every 6 (six) hours as needed for wheezing or shortness of breath. 04/26/23   Reddick, Johnathan B, NP  albuterol  (VENTOLIN  HFA) 108 (90 Base) MCG/ACT inhaler Inhale 2 puffs into the lungs every 4 (four) hours as needed for wheezing or shortness of breath. 04/30/23   Ula Prentice SAUNDERS, MD  azelastine  (ASTELIN ) 0.1 % nasal spray Place 1 spray into both nostrils 2 (two) times daily. Use in each nostril as directed 04/26/23   Reddick, Johnathan B, NP  naproxen  (NAPROSYN ) 500 MG tablet Take 1 tablet (500 mg total) by mouth 2 (two) times daily with a meal for 15 days. 12/04/23 12/19/23  Bernardino Ditch, NP    Family History History reviewed. No pertinent family history.  Social History Social History   Tobacco Use   Smoking status: Every Day    Current packs/day: 0.50    Average packs/day: 0.5 packs/day  for 10.0 years (5.0 ttl pk-yrs)    Types: Cigarettes   Smokeless tobacco: Never  Vaping Use   Vaping status: Every Day  Substance Use Topics   Alcohol use: No    Comment: 1-2x/mo   Drug use: Not Currently    Frequency: 14.0 times per week    Types: Marijuana     Allergies   Patient has no known allergies.   Review of Systems Review of Systems  Constitutional:  Negative for fever.  HENT:  Positive for dental problem. Negative for facial swelling.      Physical Exam Triage Vital Signs ED Triage Vitals  Encounter Vitals Group     BP 12/04/23 1207 119/77     Girls Systolic BP Percentile --      Girls Diastolic BP Percentile --      Boys Systolic BP Percentile --      Boys Diastolic BP Percentile --      Pulse Rate 12/04/23 1207 (!) 52     Resp 12/04/23 1207 18     Temp 12/04/23 1207 98.4 F (36.9 C)     Temp Source 12/04/23 1207 Oral     SpO2 12/04/23 1207 96 %     Weight 12/04/23 1203 165 lb (74.8 kg)     Height --  Head Circumference --      Peak Flow --      Pain Score 12/04/23 1206 10     Pain Loc --      Pain Education --      Exclude from Growth Chart --    No data found.  Updated Vital Signs BP 119/77 (BP Location: Right Arm)   Pulse (!) 52   Temp 98.4 F (36.9 C) (Oral)   Resp 18   Wt 165 lb (74.8 kg)   SpO2 96%   BMI 21.77 kg/m   Visual Acuity Right Eye Distance:   Left Eye Distance:   Bilateral Distance:    Right Eye Near:   Left Eye Near:    Bilateral Near:     Physical Exam Vitals and nursing note reviewed.  Constitutional:      Appearance: Normal appearance. He is not ill-appearing.  HENT:     Head: Normocephalic and atraumatic.     Mouth/Throat:     Mouth: Mucous membranes are moist.     Pharynx: Oropharynx is clear. No oropharyngeal exudate or posterior oropharyngeal erythema.     Comments: The left lower second premolar is tender to compression.  The tooth appears to be intact and there is no edema to the surrounding gum  tissue.  No exudate noted from around the tooth.  No swelling appreciable on the floor of the mouth and there is no submental or submandibular fullness or fluctuance noted. Skin:    General: Skin is warm and dry.     Capillary Refill: Capillary refill takes less than 2 seconds.     Findings: No rash.  Neurological:     General: No focal deficit present.     Mental Status: He is alert and oriented to person, place, and time.      UC Treatments / Results  Labs (all labs ordered are listed, but only abnormal results are displayed) Labs Reviewed - No data to display  EKG   Radiology No results found.  Procedures Procedures (including critical care time)  Medications Ordered in UC Medications - No data to display  Initial Impression / Assessment and Plan / UC Course  I have reviewed the triage vital signs and the nursing notes.  Pertinent labs & imaging results that were available during my care of the patient were reviewed by me and considered in my medical decision making (see chart for details).   Patient is a nontoxic-appearing 33 year old male presenting for reevaluation of dental pain as outlined in HPI above.  On exam he does have good dentition though he does have some dental caries in place.  His teeth appear to be intact.  The left lower second premolar is tender to compression and percussion.  I suspect that the patient is developing an apical abscess even though there is no visible swelling intraorally or externally.  I will treat her presumptively for an apical abscess with Augmentin 875 mg twice daily with food for 7 days.  He was previously prescribed Naprosyn  and I have him continue that for pain.  I have advised him that if his symptoms worsen that he should go to the emergency department at [facility] where he can be evaluated by the dentist or oral surgeon on-call.  Work note provided.   Final Clinical Impressions(s) / UC Diagnoses   Final diagnoses:  Pain,  dental     Discharge Instructions      Take the Augmentin twice daily with food for  7 days for treatment of your dental infection.  Continue to take the Naprosyn  twice daily with food to help with pain.  Rinse with warm salt water, or Listerine, after each meal to remove food particles and wash away any pus that is collecting.  If you develop any increasing or swelling, fever, pain, or difficulty swallowing you to go to the emergency department at [facility] with a have an oral surgeon and also a dentist on-call.      ED Prescriptions     Medication Sig Dispense Auth. Provider   amoxicillin-clavulanate (AUGMENTIN) 875-125 MG tablet Take 1 tablet by mouth every 12 (twelve) hours for 7 days. 14 tablet Bernardino Ditch, NP   naproxen  (NAPROSYN ) 500 MG tablet Take 1 tablet (500 mg total) by mouth 2 (two) times daily with a meal for 15 days. 30 tablet Bernardino Ditch, NP      PDMP not reviewed this encounter.   Bernardino Ditch, NP 12/04/23 1223
